# Patient Record
Sex: Female | Born: 1975 | Hispanic: Yes | Marital: Single | Smoking: Never smoker
Health system: Southern US, Community
[De-identification: ages and names within clinical notes are randomized; demographics above are authoritative.]

## PROBLEM LIST (undated history)

## (undated) HISTORY — PX: OTHER SURGICAL HISTORY: SHX169

---

## 2011-12-10 ENCOUNTER — Emergency Department: Payer: Self-pay | Admitting: Unknown Physician Specialty

## 2011-12-12 LAB — BETA STREP CULTURE(ARMC)

## 2015-11-20 ENCOUNTER — Inpatient Hospital Stay: Payer: Self-pay

## 2015-11-20 ENCOUNTER — Inpatient Hospital Stay
Admission: EM | Admit: 2015-11-20 | Discharge: 2015-11-22 | DRG: 443 | Disposition: A | Discharge status: Home / Self Care | Payer: Self-pay | Attending: Internal Medicine | Admitting: Internal Medicine

## 2015-11-20 ENCOUNTER — Emergency Department: Payer: Self-pay

## 2015-11-20 DIAGNOSIS — R0600 Dyspnea, unspecified: Secondary | ICD-10-CM

## 2015-11-20 DIAGNOSIS — Z833 Family history of diabetes mellitus: Secondary | ICD-10-CM

## 2015-11-20 DIAGNOSIS — R0781 Pleurodynia: Secondary | ICD-10-CM | POA: Diagnosis present

## 2015-11-20 DIAGNOSIS — R740 Nonspecific elevation of levels of transaminase and lactic acid dehydrogenase [LDH]: Secondary | ICD-10-CM | POA: Diagnosis present

## 2015-11-20 DIAGNOSIS — R112 Nausea with vomiting, unspecified: Secondary | ICD-10-CM

## 2015-11-20 DIAGNOSIS — M79669 Pain in unspecified lower leg: Secondary | ICD-10-CM

## 2015-11-20 DIAGNOSIS — R109 Unspecified abdominal pain: Secondary | ICD-10-CM | POA: Diagnosis present

## 2015-11-20 DIAGNOSIS — I81 Portal vein thrombosis: Principal | ICD-10-CM | POA: Diagnosis present

## 2015-11-20 DIAGNOSIS — Z6837 Body mass index (BMI) 37.0-37.9, adult: Secondary | ICD-10-CM

## 2015-11-20 DIAGNOSIS — E669 Obesity, unspecified: Secondary | ICD-10-CM | POA: Diagnosis present

## 2015-11-20 LAB — CBC WITH DIFFERENTIAL/PLATELET
Basophils Absolute: 0 10*3/uL (ref 0–0.1)
Eosinophils Absolute: 0.2 10*3/uL (ref 0–0.7)
HEMATOCRIT: 42.8 % (ref 35.0–47.0)
Hemoglobin: 14.6 g/dL (ref 12.0–16.0)
Lymphs Abs: 3.3 10*3/uL (ref 1.0–3.6)
MCH: 30.5 pg (ref 26.0–34.0)
MCHC: 34.2 g/dL (ref 32.0–36.0)
MCV: 89.3 fL (ref 80.0–100.0)
Monocytes Absolute: 1 10*3/uL — ABNORMAL HIGH (ref 0.2–0.9)
Monocytes Relative: 11 %
NEUTROS ABS: 4.2 10*3/uL (ref 1.4–6.5)
Neutrophils Relative %: 49 %
PLATELETS: 260 10*3/uL (ref 150–440)
RBC: 4.79 MIL/uL (ref 3.80–5.20)
RDW: 13.1 % (ref 11.5–14.5)
WBC: 8.7 10*3/uL (ref 3.6–11.0)

## 2015-11-20 LAB — URINALYSIS COMPLETE WITH MICROSCOPIC (ARMC ONLY)
BILIRUBIN URINE: NEGATIVE
Bacteria, UA: NONE SEEN
GLUCOSE, UA: NEGATIVE mg/dL
HGB URINE DIPSTICK: NEGATIVE
KETONES UR: NEGATIVE mg/dL
NITRITE: NEGATIVE
Protein, ur: NEGATIVE mg/dL
RBC / HPF: NONE SEEN RBC/hpf (ref 0–5)
SPECIFIC GRAVITY, URINE: 1.005 (ref 1.005–1.030)
pH: 8 (ref 5.0–8.0)

## 2015-11-20 LAB — COMPREHENSIVE METABOLIC PANEL
ALBUMIN: 3.9 g/dL (ref 3.5–5.0)
ALT: 68 U/L — ABNORMAL HIGH (ref 14–54)
ANION GAP: 8 (ref 5–15)
AST: 52 U/L — ABNORMAL HIGH (ref 15–41)
Alkaline Phosphatase: 77 U/L (ref 38–126)
BILIRUBIN TOTAL: 0.5 mg/dL (ref 0.3–1.2)
BUN: 12 mg/dL (ref 6–20)
CO2: 24 mmol/L (ref 22–32)
Calcium: 9.4 mg/dL (ref 8.9–10.3)
Chloride: 107 mmol/L (ref 101–111)
Creatinine, Ser: 0.57 mg/dL (ref 0.44–1.00)
GFR calc Af Amer: >60 mL/min (ref >60)
Glucose, Bld: 107 mg/dL — ABNORMAL HIGH (ref 65–99)
POTASSIUM: 3.9 mmol/L (ref 3.5–5.1)
Sodium: 139 mmol/L (ref 135–145)
TOTAL PROTEIN: 7.3 g/dL (ref 6.5–8.1)

## 2015-11-20 LAB — TROPONIN I
Troponin I: <0.03 ng/mL (ref <0.031)
Troponin I: <0.03 ng/mL (ref <0.031)
Troponin I: <0.03 ng/mL (ref <0.031)

## 2015-11-20 LAB — HEPARIN LEVEL (UNFRACTIONATED)
Heparin Unfractionated: 0.64 IU/mL (ref 0.30–0.70)
Heparin Unfractionated: 0.59 IU/mL (ref 0.30–0.70)

## 2015-11-20 LAB — PROTIME-INR
INR: 0.98
Prothrombin Time: 13.2 seconds (ref 11.4–15.0)

## 2015-11-20 LAB — LIPASE, BLOOD: LIPASE: 21 U/L (ref 11–51)

## 2015-11-20 LAB — APTT: aPTT: 31 seconds (ref 24–36)

## 2015-11-20 LAB — POCT PREGNANCY, URINE: PREG TEST UR: NEGATIVE

## 2015-11-20 MED ORDER — HEPARIN (PORCINE) IN NACL 100-0.45 UNIT/ML-% IJ SOLN
1200.0000 [IU]/h | INTRAMUSCULAR | Status: DC
  Administered 2015-11-20 – 2015-11-21 (×2): 1200 [IU]/h via INTRAVENOUS
  Filled 2015-11-20 (×6): qty 250

## 2015-11-20 MED ORDER — HEPARIN BOLUS VIA INFUSION
4000.0000 [IU] | Freq: Once | INTRAVENOUS | Status: AC
  Administered 2015-11-20: 4000 [IU] via INTRAVENOUS
  Filled 2015-11-20: qty 4000

## 2015-11-20 MED ORDER — ONDANSETRON HCL 4 MG/2ML IJ SOLN: 4.0000 mg | Freq: Four times a day (QID) | INTRAMUSCULAR | Status: DC | PRN

## 2015-11-20 MED ORDER — HYDROCODONE-ACETAMINOPHEN 5-325 MG PO TABS
1.0000 | ORAL_TABLET | ORAL | Status: DC | PRN
  Administered 2015-11-20 – 2015-11-21 (×3): 1 via ORAL
  Filled 2015-11-20 (×2): qty 1
  Filled 2015-11-20: qty 2
  Filled 2015-11-20: qty 1

## 2015-11-20 MED ORDER — SODIUM CHLORIDE 0.9 % IV SOLN
INTRAVENOUS | Status: DC
  Administered 2015-11-20 – 2015-11-22 (×3): via INTRAVENOUS

## 2015-11-20 MED ORDER — MORPHINE SULFATE (PF) 2 MG/ML IV SOLN: 2.0000 mg | INTRAVENOUS | Status: DC | PRN

## 2015-11-20 MED ORDER — IOPAMIDOL (ISOVUE-370) INJECTION 76%
100.0000 mL | Freq: Once | INTRAVENOUS | Status: AC | PRN
  Administered 2015-11-20: 100 mL via INTRAVENOUS

## 2015-11-20 MED ORDER — SENNOSIDES-DOCUSATE SODIUM 8.6-50 MG PO TABS: 1.0000 | ORAL_TABLET | Freq: Every evening | ORAL | Status: DC | PRN

## 2015-11-20 MED ORDER — ONDANSETRON HCL 4 MG/2ML IJ SOLN
4.0000 mg | Freq: Once | INTRAMUSCULAR | Status: AC
  Administered 2015-11-20: 4 mg via INTRAVENOUS
  Filled 2015-11-20: qty 2

## 2015-11-20 MED ORDER — DIATRIZOATE MEGLUMINE & SODIUM 66-10 % PO SOLN
15.0000 mL | Freq: Once | ORAL | Status: AC
  Administered 2015-11-20: 15 mL via ORAL
  Filled 2015-11-20: qty 30

## 2015-11-20 MED ORDER — ONDANSETRON HCL 4 MG PO TABS: 4.0000 mg | ORAL_TABLET | Freq: Four times a day (QID) | ORAL | Status: DC | PRN

## 2015-11-20 MED ORDER — MORPHINE SULFATE (PF) 4 MG/ML IV SOLN
4.0000 mg | Freq: Once | INTRAVENOUS | Status: AC
  Administered 2015-11-20: 4 mg via INTRAVENOUS
  Filled 2015-11-20: qty 1

## 2015-11-20 NOTE — Progress Notes (Signed)
ANTICOAGULATION CONSULT NOTE - Follow Up Consult  Pharmacy Consult for heparin Indication: portal vein thrombosis  No Known Allergies  Patient Measurements: Height: 5\' 1"  (154.9 cm) Weight: 200 lb (90.719 kg) IBW/kg (Calculated) : 47.8 Heparin Dosing Weight: 69 kg  Vital Signs: Temp: 98.7 F (37.1 C) (04/27 0827) Temp Source: Oral (04/27 0827) BP: 110/69 mmHg (04/27 0827) Pulse Rate: 72 (04/27 0827)  Labs:  Recent Labs  11/20/15 0155 11/20/15 0607 11/20/15 0722 11/20/15 1302  HGB 14.6  --   --   --   HCT 42.8  --   --   --   PLT 260  --   --   --   APTT  --  31  --   --   LABPROT  --  13.2  --   --   INR  --  0.98  --   --   HEPARINUNFRC  --   --   --  0.64  CREATININE 0.57  --   --   --   TROPONINI <0.03  --  <0.03  --     Estimated Creatinine Clearance: 95.9 mL/min (by C-G formula based on Cr of 0.57).   Medical History: History reviewed. No pertinent past medical history.  Medications:  Infusions:  . sodium chloride 75 mL/hr at 11/20/15 0844  . heparin 1,200 Units/hr (11/20/15 0705)    Assessment: Kristina Eaton with chest pain since 10 AM and noted left portal vein thrombosis on CT. Pharmacy consulted to dose and monitor heparin drip.  Patient currently ordered heparin drip at rate of 1200 units/hr  HL on 4/27 at 1300: 0.64  Goal of Therapy:  Heparin level 0.3-0.7 units/ml Monitor platelets by anticoagulation protocol: Yes   Plan:  HL within desired range.  Will continue current rate and recheck a confirmatory level in 6 hours at 1900.  If level remains in goal range, may transition to daily HL.   Estus Krakowski G, Pharm.D., BCPS Clinical Pharmacist 11/20/2015,1:39 PM

## 2015-11-20 NOTE — ED Notes (Signed)
Pt transported to xray via stretcher

## 2015-11-20 NOTE — ED Notes (Signed)
Per ems and pt report to interpreter Gabrielle [id number] she started having chest pain this am at 10am and since then has started to radiate into back - pt also reports no period in 6 months and has noticed abd growth with movement in her stomach 

## 2015-11-20 NOTE — H&P (Signed)
[facility] [place] [facility]   PATIENT NAME: Amiree No    MR#:  [id number]  DATE OF BIRTH:  04-21-1976  DATE OF ADMISSION:  11/20/2015  PRIMARY CARE PHYSICIAN: No PCP Per Patient   REQUESTING/REFERRING PHYSICIAN:   CHIEF COMPLAINT:   Chief Complaint  Patient presents with  . Chest Pain    HISTORY OF PRESENT ILLNESS: Ruchama Kubicek  is a 40 y.o. female with no significant past medical history presented to the emergency room with chest pain and abdominal pain of one-day duration. The abdominal pain is located in the epigastrium and right upper quadrant pain is sharp in nature is 8 out of 10 on scale of 1-10. No history of any nausea or vomiting. No history of fever or chills or cough. Patient complains of chest pain which is aching in nature 4 out of 10. Patient was worked up with abdominal ultrasound in the emergency room which did not show any gallstones. She was worked up with a CT chest and abdomen which showed portal vein thrombosis. No history of any prior DVT or any clots. Case was discussed with vascular surgery by ER physician who recommended to start patient on IV heparin drip. No history of any recent travel.  PAST MEDICAL HISTORY:  History reviewed. No pertinent past medical history.  PAST SURGICAL HISTORY: Past Surgical History  Procedure Laterality Date  . None      SOCIAL HISTORY:  Social History  Substance Use Topics  . Smoking status: Never Smoker   . Smokeless tobacco: Not on file  . Alcohol Use: No    FAMILY HISTORY:  Family History  Problem Relation Age of Onset  . Diabetes Brother     DRUG ALLERGIES: No Known Allergies  REVIEW OF SYSTEMS:   CONSTITUTIONAL: No fever, fatigue or weakness.  EYES: No blurred or double vision.  EARS, NOSE, AND THROAT: No tinnitus or ear pain.  RESPIRATORY: No cough, shortness of breath, wheezing or hemoptysis.  CARDIOVASCULAR: Has  chest pain,no orthopnea, edema.  GASTROINTESTINAL: No  nausea, vomiting, diarrhea, has abdominal pain.  GENITOURINARY: No dysuria, hematuria.  ENDOCRINE: No polyuria, nocturia,  HEMATOLOGY: No anemia, easy bruising or bleeding SKIN: No rash or lesion. MUSCULOSKELETAL: No joint pain or arthritis.   NEUROLOGIC: No tingling, numbness, weakness.  PSYCHIATRY: No anxiety or depression.   MEDICATIONS AT HOME:  Prior to Admission medications   Not on File      PHYSICAL EXAMINATION:   VITAL SIGNS: Blood pressure 103/66, pulse 82, temperature 98.4 F (36.9 C), temperature source Oral, resp. rate 14, height 5\' 1"  (1.549 m), weight 90.719 kg (200 lb), last menstrual period 05/22/2015, SpO2 95 %.  GENERAL:  40 y.o.-year-old patient lying in the bed with no acute distress.  EYES: Pupils equal, round, reactive to light and accommodation. No scleral icterus. Extraocular muscles intact.  HEENT: Head atraumatic, normocephalic. Oropharynx and nasopharynx clear.  NECK:  Supple, no jugular venous distention. No thyroid enlargement, no tenderness.  LUNGS: Normal breath sounds bilaterally, no wheezing, rales,rhonchi or crepitation. No use of accessory muscles of respiration.  CARDIOVASCULAR: S1, S2 normal. No murmurs, rubs, or gallops.  ABDOMEN: Soft, tenderness in the epigastrium,right upper quadrant, nondistended. Bowel sounds present. No organomegaly or mass.  EXTREMITIES: No pedal edema, cyanosis, or clubbing.  NEUROLOGIC: Cranial nerves II through XII are intact. Muscle strength 5/5 in all extremities. Sensation intact. Gait normal. PSYCHIATRIC: The patient is alert and oriented x 3.  SKIN: No obvious rash, lesion, or  ulcer.   LABORATORY PANEL:   CBC  Recent Labs Lab 11/20/15 0155  WBC 8.7  HGB 14.6  HCT 42.8  PLT 260  MCV 89.3  MCH 30.5  MCHC 34.2  RDW 13.1  LYMPHSABS 3.3  MONOABS 1.0*  EOSABS 0.2  BASOSABS 0.0    ------------------------------------------------------------------------------------------------------------------  Chemistries   Recent Labs Lab 11/20/15 0155  NA 139  K 3.9  CL 107  CO2 24  GLUCOSE 107*  BUN 12  CREATININE 0.57  CALCIUM 9.4  AST 52*  ALT 68*  ALKPHOS 77  BILITOT 0.5   ------------------------------------------------------------------------------------------------------------------ estimated creatinine clearance is 95.9 mL/min (by C-G formula based on Cr of 0.57). ------------------------------------------------------------------------------------------------------------------ No results for input(s): TSH, T4TOTAL, T3FREE, THYROIDAB in the last 72 hours.  Invalid input(s): FREET3   Coagulation profile  Recent Labs Lab 11/20/15 0607  INR 0.98   ------------------------------------------------------------------------------------------------------------------- No results for input(s): DDIMER in the last 72 hours. -------------------------------------------------------------------------------------------------------------------  Cardiac Enzymes  Recent Labs Lab 11/20/15 0155  TROPONINI <0.03   ------------------------------------------------------------------------------------------------------------------ Invalid input(s): POCBNP  ---------------------------------------------------------------------------------------------------------------  Urinalysis    Component Value Date/Time   COLORURINE COLORLESS* 11/20/2015 0241   APPEARANCEUR CLEAR* 11/20/2015 0241   LABSPEC 1.005 11/20/2015 0241   PHURINE 8.0 11/20/2015 0241   GLUCOSEU NEGATIVE 11/20/2015 0241   HGBUR NEGATIVE 11/20/2015 0241   BILIRUBINUR NEGATIVE 11/20/2015 0241   KETONESUR NEGATIVE 11/20/2015 0241   PROTEINUR NEGATIVE 11/20/2015 0241   NITRITE NEGATIVE 11/20/2015 0241   LEUKOCYTESUR TRACE* 11/20/2015 0241     RADIOLOGY: Dg Chest 2 View  11/20/2015  CLINICAL DATA:   Acute onset of generalized chest pain, radiating to the back. Initial encounter. EXAM: CHEST  2 VIEW COMPARISON:  None. FINDINGS: The lungs are mildly hypoexpanded. Mild vascular congestion is noted. There is no evidence of focal opacification, pleural effusion or pneumothorax. The heart is normal in size; the mediastinal contour is within normal limits. No acute osseous abnormalities are seen. IMPRESSION: Lungs mildly hypoexpanded. Mild vascular congestion noted. Lungs remain grossly clear. Electronically Signed   By: Roanna Raider M.D.   On: 11/20/2015 03:36   Ct Angio Chest Pe W/cm &/or Wo Cm  11/20/2015  CLINICAL DATA:  Severe chest pain with inspiration. Absent periods for 6 months and increasing abdominal distention. EXAM: CT ANGIOGRAPHY CHEST CT ABDOMEN AND PELVIS WITH CONTRAST TECHNIQUE: Multidetector CT imaging of the chest was performed using the standard protocol during bolus administration of intravenous contrast. Multiplanar CT image reconstructions and MIPs were obtained to evaluate the vascular anatomy. Multidetector CT imaging of the abdomen and pelvis was performed using the standard protocol during bolus administration of intravenous contrast. CONTRAST:  100 cc Isovue 370 intravenous COMPARISON:  None. FINDINGS: CTA CHEST FINDINGS THORACIC INLET/BODY WALL: No acute abnormality. MEDIASTINUM: Normal heart size. No pericardial effusion. Evaluation of peripheral pulmonary arteries is limited by interruption of contrast. There is no evidence of pulmonary embolism. Calcified right hilar lymph nodes attributed to previous granulomatous disease. Small hiatal hernia with poor esophageal clearance or reflux. LUNG WINDOWS: There is no edema, consolidation, effusion, or pneumothorax. OSSEOUS: No acute fracture.  No suspicious lytic or blastic lesions. CT ABDOMEN and PELVIS FINDINGS Abdominal wall:  No contributory findings. Hepatobiliary: Left portal vein thrombosis, beginning at the bifurcation, with  vessel expansion and differential liver enhancement. No underlying inflammation or portal venous gas. No evidence of biliary obstruction or stone. Pancreas: Unremarkable. Spleen: Unremarkable. Adrenals/Urinary Tract: Negative adrenals. No hydronephrosis or stone. Unremarkable bladder. Reproductive:No pathologic findings.  Corpus luteum on the left.  Stomach/Bowel:  No obstruction. No appendicitis. Vascular/Lymphatic: No acute vascular abnormality. No mass or adenopathy. Peritoneal: No ascites or pneumoperitoneum. Musculoskeletal: Negative. Review of the MIP images confirms the above findings. IMPRESSION: 1. Left portal vein thrombosis. 2. No evidence of pulmonary embolism or other acute cardiopulmonary disease. Electronically Signed   By: Marnee Spring M.D.   On: 11/20/2015 05:51   Ct Abdomen Pelvis W Contrast  11/20/2015  CLINICAL DATA:  Severe chest pain with inspiration. Absent periods for 6 months and increasing abdominal distention. EXAM: CT ANGIOGRAPHY CHEST CT ABDOMEN AND PELVIS WITH CONTRAST TECHNIQUE: Multidetector CT imaging of the chest was performed using the standard protocol during bolus administration of intravenous contrast. Multiplanar CT image reconstructions and MIPs were obtained to evaluate the vascular anatomy. Multidetector CT imaging of the abdomen and pelvis was performed using the standard protocol during bolus administration of intravenous contrast. CONTRAST:  100 cc Isovue 370 intravenous COMPARISON:  None. FINDINGS: CTA CHEST FINDINGS THORACIC INLET/BODY WALL: No acute abnormality. MEDIASTINUM: Normal heart size. No pericardial effusion. Evaluation of peripheral pulmonary arteries is limited by interruption of contrast. There is no evidence of pulmonary embolism. Calcified right hilar lymph nodes attributed to previous granulomatous disease. Small hiatal hernia with poor esophageal clearance or reflux. LUNG WINDOWS: There is no edema, consolidation, effusion, or pneumothorax.  OSSEOUS: No acute fracture.  No suspicious lytic or blastic lesions. CT ABDOMEN and PELVIS FINDINGS Abdominal wall:  No contributory findings. Hepatobiliary: Left portal vein thrombosis, beginning at the bifurcation, with vessel expansion and differential liver enhancement. No underlying inflammation or portal venous gas. No evidence of biliary obstruction or stone. Pancreas: Unremarkable. Spleen: Unremarkable. Adrenals/Urinary Tract: Negative adrenals. No hydronephrosis or stone. Unremarkable bladder. Reproductive:No pathologic findings.  Corpus luteum on the left. Stomach/Bowel:  No obstruction. No appendicitis. Vascular/Lymphatic: No acute vascular abnormality. No mass or adenopathy. Peritoneal: No ascites or pneumoperitoneum. Musculoskeletal: Negative. Review of the MIP images confirms the above findings. IMPRESSION: 1. Left portal vein thrombosis. 2. No evidence of pulmonary embolism or other acute cardiopulmonary disease. Electronically Signed   By: Marnee Spring M.D.   On: 11/20/2015 05:51   US Abdomen Limited Ruq  11/20/2015  CLINICAL DATA:  Right upper quadrant and epigastric pain with nausea and vomiting EXAM: US ABDOMEN LIMITED - RIGHT UPPER QUADRANT COMPARISON:  None. FINDINGS: Gallbladder: No gallstones or wall thickening visualized. No sonographic Murphy sign noted by sonographer. Common bile duct: Diameter: 4 mm.  Where visualized, no filling defect. Liver: No focal lesion identified. Within normal limits in parenchymal echogenicity. Antegrade flow in the imaged portal venous system. IMPRESSION: Normal study. Electronically Signed   By: Marnee Spring M.D.   On: 11/20/2015 04:00    EKG: Orders placed or performed during the hospital encounter of 11/20/15  . EKG 12-Lead  . EKG 12-Lead    IMPRESSION AND PLAN: 40 year old female patient with no significant past medical history presented to the emergency room with abdominal pain and chest discomfort. Admitting diagnosis 1. Abdominal  pain 2. Left portal vein thrombosis 3. Transaminitis 4. Atypical chest pain Treatment plan Admit patient to medical floor inpatient service Start patient on IV heparin drip Follow-up liver function tests Pain management with oral narcotic and when necessary morphine Hematology consult for portal vein thrombosis Cycle troponin to rule out ischemia Discussed medical condition and treatment plan with patient in detail with the help of the translator.  All the records are reviewed and case discussed with ED provider. Management plans discussed with the patient, family and  they are in agreement.  CODE STATUS:FULL Code Status History    This patient does not have a recorded code status. Please follow your organizational policy for patients in this situation.       TOTAL TIME TAKING CARE OF THIS PATIENT: 51 minutes.    Ihor AustinPavan Marrell Dicaprio M.D on 11/20/2015 at 6:51 AM  Between 7am to 6pm - Pager - [phone]  After 6pm go to [email or web address] - password [email or web address] [facility]  [facility]  Office  [phone]  CC: Primary care physician; No PCP Per Patient

## 2015-11-20 NOTE — Progress Notes (Signed)
The pt complains of diffuse abd pain and weakness. VS reviewed, stable and physical examination done. B/l calf tenderness.  Left portal vein thrombosis. Continue heparin drip. F/u hematology consult.  B/l calf tenderness. Venous US to R/O DVT.  With help of spanish interpretor.

## 2015-11-20 NOTE — ED Notes (Signed)
Pt transported to US via stretcher.  

## 2015-11-20 NOTE — ED Provider Notes (Signed)
[facility] Emergency Department Provider Note  ____________________________________________  Time seen: Approximately 3:16 AM  I have reviewed the triage vital signs and the nursing notes.   HISTORY  Chief Complaint Chest Pain  The patient and/or family speak(s) Spanish.  They understand they have the right to the use of a hospital interpreter, however at this time they prefer to speak directly with me in Spanish.  They know that they can ask for an interpreter at any time.   HPI Emyah Roznowski is a 40 y.o. female with no significant past medical history who presents with acute onset of what she describes as chest pain earlier today.He states that the pain will get better but then come back very strong.  It is located at the bottom of her sternum as well as on the right side. It radiates through to her back.  The pain is worse with deep breaths.  She has never experienced anything like this before.  She has no history of heart disease, diabetes, smoking, hypercholesterolemia.  She has never had any surgeries.  The pain started about 15 hours ago and has been getting worse.  She has not had any lower abdominal pain nor bowel symptoms.  She has also been having some burning when she urinates which she describes as severe.  Overall she states that her pain is severe and nothing is making it better or worse.   History reviewed. No pertinent past medical history.  Patient Active Problem List   Diagnosis Date Noted  . Abdominal pain 11/20/2015  . Portal vein thrombosis 11/20/2015    Past Surgical History  Procedure Laterality Date  . None      No current outpatient prescriptions on file.  Allergies Review of patient's allergies indicates no known allergies.  Family History  Problem Relation Age of Onset  . Diabetes Brother     Social History Social History  Substance Use Topics  . Smoking status: Never Smoker   . Smokeless tobacco: None  . Alcohol  Use: No    Review of Systems Constitutional: No fever/chills Eyes: No visual changes. ENT: No sore throat. Cardiovascular: +chest pain. Respiratory: Denies shortness of breath. Gastrointestinal: Upper abdominal pain.  Nausea and vomiting.  No diarrhea.  No constipation. Genitourinary: +dysuria. Musculoskeletal: Negative for back pain. Skin: Negative for rash. Neurological: Negative for headaches, focal weakness or numbness.  10-point ROS otherwise negative.  ____________________________________________   PHYSICAL EXAM:  VITAL SIGNS: ED Triage Vitals  Enc Vitals Group     BP 11/20/15 0153 132/71 mmHg     Pulse Rate 11/20/15 0153 87     Resp 11/20/15 0153 20     Temp 11/20/15 0153 98.4 F (36.9 C)     Temp Source 11/20/15 0153 Oral     SpO2 11/20/15 0153 99 %     Weight 11/20/15 0153 205 lb (92.987 kg)     Height 11/20/15 0153 5\' 7"  (1.702 m)     Head Cir --      Peak Flow --      Pain Score 11/20/15 0216 9     Pain Loc --      Pain Edu? --      Excl. in GC? --     Constitutional: Alert and oriented. Tearful, appears to be in pain Eyes: Conjunctivae are normal. PERRL. EOMI. Head: Atraumatic. Nose: No congestion/rhinnorhea. Mouth/Throat: Mucous membranes are moist.  Oropharynx non-erythematous. Neck: No stridor.  No meningeal signs.   Cardiovascular: Normal rate,  regular rhythm. Good peripheral circulation. Grossly normal heart sounds.  No chest wall tenderness Respiratory: Normal respiratory effort.  No retractions. Lungs CTAB. Gastrointestinal: Obese.  Severe tenderness to palpation of her epigastrium and right upper quadrant with positive Murphy sign.  No lower abdominal pain or tenderness. Musculoskeletal: No lower extremity tenderness nor edema. No gross deformities of extremities. Neurologic:  Normal speech and language. No gross focal neurologic deficits are appreciated.  Skin:  Skin is warm, dry and intact. No rash noted. Psychiatric: Mood and affect are  normal. Speech and behavior are normal.  ____________________________________________   LABS (all labs ordered are listed, but only abnormal results are displayed)  Labs Reviewed  CBC WITH DIFFERENTIAL/PLATELET - Abnormal; Notable for the following:    Monocytes Absolute 1.0 (*)    All other components within normal limits  COMPREHENSIVE METABOLIC PANEL - Abnormal; Notable for the following:    Glucose, Bld 107 (*)    AST 52 (*)    ALT 68 (*)    All other components within normal limits  URINALYSIS COMPLETEWITH MICROSCOPIC ([facility] ONLY) - Abnormal; Notable for the following:    Color, Urine COLORLESS (*)    APPearance CLEAR (*)    Leukocytes, UA TRACE (*)    Squamous Epithelial / LPF 0-5 (*)    All other components within normal limits  LIPASE, BLOOD  TROPONIN I  APTT  PROTIME-INR  HEPARIN LEVEL (UNFRACTIONATED)  TROPONIN I  TROPONIN I  TROPONIN I  POC URINE PREG, ED  POCT PREGNANCY, URINE   ____________________________________________  EKG  ED ECG REPORT I, Malayjah Otoole, the attending physician, personally viewed and interpreted this ECG.  Date: 11/20/2015 EKG Time: 01:49 Rate: 88 Rhythm: normal sinus rhythm QRS Axis: normal Intervals: normal ST/T Wave abnormalities: normal Conduction Disturbances: none Narrative Interpretation: unremarkable  ____________________________________________  RADIOLOGY   Dg Chest 2 View  11/20/2015  CLINICAL DATA:  Acute onset of generalized chest pain, radiating to the back. Initial encounter. EXAM: CHEST  2 VIEW COMPARISON:  None. FINDINGS: The lungs are mildly hypoexpanded. Mild vascular congestion is noted. There is no evidence of focal opacification, pleural effusion or pneumothorax. The heart is normal in size; the mediastinal contour is within normal limits. No acute osseous abnormalities are seen. IMPRESSION: Lungs mildly hypoexpanded. Mild vascular congestion noted. Lungs remain grossly clear. Electronically Signed   By:  Roanna RaiderJeffery  Chang M.D.   On: 11/20/2015 03:36   Ct Angio Chest Pe W/cm &/or Wo Cm  11/20/2015  CLINICAL DATA:  Severe chest pain with inspiration. Absent periods for 6 months and increasing abdominal distention. EXAM: CT ANGIOGRAPHY CHEST CT ABDOMEN AND PELVIS WITH CONTRAST TECHNIQUE: Multidetector CT imaging of the chest was performed using the standard protocol during bolus administration of intravenous contrast. Multiplanar CT image reconstructions and MIPs were obtained to evaluate the vascular anatomy. Multidetector CT imaging of the abdomen and pelvis was performed using the standard protocol during bolus administration of intravenous contrast. CONTRAST:  100 cc Isovue 370 intravenous COMPARISON:  None. FINDINGS: CTA CHEST FINDINGS THORACIC INLET/BODY WALL: No acute abnormality. MEDIASTINUM: Normal heart size. No pericardial effusion. Evaluation of peripheral pulmonary arteries is limited by interruption of contrast. There is no evidence of pulmonary embolism. Calcified right hilar lymph nodes attributed to previous granulomatous disease. Small hiatal hernia with poor esophageal clearance or reflux. LUNG WINDOWS: There is no edema, consolidation, effusion, or pneumothorax. OSSEOUS: No acute fracture.  No suspicious lytic or blastic lesions. CT ABDOMEN and PELVIS FINDINGS Abdominal wall:  No  contributory findings. Hepatobiliary: Left portal vein thrombosis, beginning at the bifurcation, with vessel expansion and differential liver enhancement. No underlying inflammation or portal venous gas. No evidence of biliary obstruction or stone. Pancreas: Unremarkable. Spleen: Unremarkable. Adrenals/Urinary Tract: Negative adrenals. No hydronephrosis or stone. Unremarkable bladder. Reproductive:No pathologic findings.  Corpus luteum on the left. Stomach/Bowel:  No obstruction. No appendicitis. Vascular/Lymphatic: No acute vascular abnormality. No mass or adenopathy. Peritoneal: No ascites or pneumoperitoneum.  Musculoskeletal: Negative. Review of the MIP images confirms the above findings. IMPRESSION: 1. Left portal vein thrombosis. 2. No evidence of pulmonary embolism or other acute cardiopulmonary disease. Electronically Signed   By: Marnee Spring M.D.   On: 11/20/2015 05:51   Ct Abdomen Pelvis W Contrast  11/20/2015  CLINICAL DATA:  Severe chest pain with inspiration. Absent periods for 6 months and increasing abdominal distention. EXAM: CT ANGIOGRAPHY CHEST CT ABDOMEN AND PELVIS WITH CONTRAST TECHNIQUE: Multidetector CT imaging of the chest was performed using the standard protocol during bolus administration of intravenous contrast. Multiplanar CT image reconstructions and MIPs were obtained to evaluate the vascular anatomy. Multidetector CT imaging of the abdomen and pelvis was performed using the standard protocol during bolus administration of intravenous contrast. CONTRAST:  100 cc Isovue 370 intravenous COMPARISON:  None. FINDINGS: CTA CHEST FINDINGS THORACIC INLET/BODY WALL: No acute abnormality. MEDIASTINUM: Normal heart size. No pericardial effusion. Evaluation of peripheral pulmonary arteries is limited by interruption of contrast. There is no evidence of pulmonary embolism. Calcified right hilar lymph nodes attributed to previous granulomatous disease. Small hiatal hernia with poor esophageal clearance or reflux. LUNG WINDOWS: There is no edema, consolidation, effusion, or pneumothorax. OSSEOUS: No acute fracture.  No suspicious lytic or blastic lesions. CT ABDOMEN and PELVIS FINDINGS Abdominal wall:  No contributory findings. Hepatobiliary: Left portal vein thrombosis, beginning at the bifurcation, with vessel expansion and differential liver enhancement. No underlying inflammation or portal venous gas. No evidence of biliary obstruction or stone. Pancreas: Unremarkable. Spleen: Unremarkable. Adrenals/Urinary Tract: Negative adrenals. No hydronephrosis or stone. Unremarkable bladder. Reproductive:No  pathologic findings.  Corpus luteum on the left. Stomach/Bowel:  No obstruction. No appendicitis. Vascular/Lymphatic: No acute vascular abnormality. No mass or adenopathy. Peritoneal: No ascites or pneumoperitoneum. Musculoskeletal: Negative. Review of the MIP images confirms the above findings. IMPRESSION: 1. Left portal vein thrombosis. 2. No evidence of pulmonary embolism or other acute cardiopulmonary disease. Electronically Signed   By: Marnee Spring M.D.   On: 11/20/2015 05:51   US Abdomen Limited Ruq  11/20/2015  CLINICAL DATA:  Right upper quadrant and epigastric pain with nausea and vomiting EXAM: US ABDOMEN LIMITED - RIGHT UPPER QUADRANT COMPARISON:  None. FINDINGS: Gallbladder: No gallstones or wall thickening visualized. No sonographic Murphy sign noted by sonographer. Common bile duct: Diameter: 4 mm.  Where visualized, no filling defect. Liver: No focal lesion identified. Within normal limits in parenchymal echogenicity. Antegrade flow in the imaged portal venous system. IMPRESSION: Normal study. Electronically Signed   By: Marnee Spring M.D.   On: 11/20/2015 04:00    ____________________________________________   PROCEDURES  Procedure(s) performed: None  Critical Care performed: No ____________________________________________   INITIAL IMPRESSION / ASSESSMENT AND PLAN / ED COURSE  Pertinent labs & imaging results that were available during my care of the patient were reviewed by me and considered in my medical decision making (see chart for details).  I believe the with the patient is interpreting his chest pain is actually right upper quadrant and epigastric pain.  The most likely culprit is gallbladder  disease.  Her labs are all pending and I will proceed with a upper quadrant ultrasound.  I am giving morphine and Zofran for pain control.  I explained all this to the patient and her family and they understand and agree with the  plan.  ----------------------------------------- 4:23 AM on 11/20/2015 -----------------------------------------  Ultrasound was surprisingly unremarkable although the patient does have a slight elevation of her AST and ALT.  I will proceed with CT scans of both the chest and the abdomen and pelvis to try to establish the cause of her severe pain including ruling out pulmonary embolism.  ----------------------------------------- 5:56 AM on 11/20/2015 -----------------------------------------  CT scans notable for left portal vein thrombosis.  I have ordered heparin from the pharmacy and have paged Dr. Wyn Quaker with vascular surgery.   (Note that documentation was delayed due to multiple ED patients requiring immediate care.)   Spoke with Dr. Wyn Quaker who agreed with plan for heparin (bolus + infusion) and admission.  Discussed with family who understands and agrees. ____________________________________________  FINAL CLINICAL IMPRESSION(S) / ED DIAGNOSES  Final diagnoses:  Portal vein thrombosis  Abdominal pain, unspecified abdominal location  Non-intractable vomiting with nausea, vomiting of unspecified type      NEW MEDICATIONS STARTED DURING THIS VISIT:  New Prescriptions   No medications on file      Note:  This document was prepared using Dragon voice recognition software and may include unintentional dictation errors.   Loleta Rose, MD 11/20/15 [phone]

## 2015-11-20 NOTE — ED Notes (Signed)
Dr. Forbach at bedside.  

## 2015-11-20 NOTE — ED Notes (Signed)
Per ems and pt report to interpreter Kristina IvoryGabrielle [phone] she started having chest pain this am at 10am and since then has started to radiate into back - pt also reports no period in 6 months and has noticed abd growth with movement in her stomach

## 2015-11-20 NOTE — ED Notes (Signed)
Heparin infusion verified with April Brumgard, RN

## 2015-11-20 NOTE — Care Management (Signed)
Patient admitted for blood clot- no health insurance. Ana RN introduced me as patient is Spanish speaking. RNCM will follow up with Spanish interpreter to interview patient regarding PCP follow up and medication needs. Rx will be needed in order to assist with any med need. RNCM will continue to follow as physician decides what treatment is recommended.

## 2015-11-20 NOTE — ED Notes (Signed)
Pt finished drinking contrast, CT notified 

## 2015-11-20 NOTE — Progress Notes (Signed)
ANTICOAGULATION CONSULT NOTE - Initial Consult  Pharmacy Consult for heparin Indication: portal vein thrombosis  No Known Allergies  Patient Measurements: Height: 5\' 1"  (154.9 cm) Weight: 200 lb (90.719 kg) IBW/kg (Calculated) : 47.8 Heparin Dosing Weight: 69 kg  Vital Signs: Temp: 98.4 F (36.9 C) (04/27 0214) Temp Source: Oral (04/27 0214) BP: 103/66 mmHg (04/27 0430) Pulse Rate: 82 (04/27 0430)  Labs:  Recent Labs  11/20/15 0155  HGB 14.6  HCT 42.8  PLT 260  CREATININE 0.57  TROPONINI <0.03    Estimated Creatinine Clearance: 95.9 mL/min (by C-G formula based on Cr of 0.57).   Medical History: History reviewed. No pertinent past medical history.  Medications:  Infusions:  . heparin      Assessment: 39 yof with chest pain since 10 AM and noted left portal vein thrombosis on CT. Pharmacy consulted to dose heparin.  Goal of Therapy:  Heparin level 0.3-0.7 units/ml Monitor platelets by anticoagulation protocol: Yes   Plan:  Give 4000 units bolus x 1 Start heparin infusion at 1200 units/hr Check anti-Xa level in 6 hours and daily while on heparin Continue to monitor H&H and platelets  Carola FrostNathan A Rainn Zupko, Pharm.D., BCPS Clinical Pharmacist 11/20/2015,6:07 AM

## 2015-11-20 NOTE — Care Management Note (Addendum)
Case Management Note  Patient Details  Name: Kristina Eaton MRN: [id number] Date of Birth: Jan 18, 1976  Subjective/Objective:                  Met with patient and her husband along with [place] interpreter. Patient states she has lived in [place] for 10 years. She presents with a blood clot. She and her husband state that they have no income at this time when I asked if they make less than $2500/month. She has no PCP. She has transportation. Her contact is [phone]. She agrees to [facility] referral.  Action/Plan: Spanish application to [facility] delivered to patient along with Spanish version of Medication Management if they are able to assist. RNCM to follow for medication assistance as patient may need MATCH or any coupons available as I expect long-term treatment need.   Expected Discharge Date:                  Expected Discharge Plan:     In-House Referral:     Discharge planning Services  CM Consult, Medication Assistance, [place] [facility]  Post Acute Care Choice:    Choice offered to:     DME Arranged:    DME Agency:     HH Arranged:    HH Agency:     Status of Service:  In process, will continue to follow  Medicare Important Message Given:    Date Medicare IM Given:    Medicare IM give by:    Date Additional Medicare IM Given:    Additional Medicare Important Message give by:     If discussed at [place] of Stay Meetings, dates discussed:    Additional Comments:  Marshell Garfinkel, RN 11/20/2015, 9:04 AM

## 2015-11-20 NOTE — Progress Notes (Signed)
ANTICOAGULATION CONSULT NOTE - Follow Up Consult  Pharmacy Consult for heparin Indication: portal vein thrombosis  No Known Allergies  Patient Measurements: Height: 5\' 1"  (154.9 cm) Weight: 200 lb (90.719 kg) IBW/kg (Calculated) : 47.8 Heparin Dosing Weight: 69 kg  Vital Signs: Temp: 98.3 F (36.8 C) (04/27 1930) Temp Source: Oral (04/27 1930) BP: 96/54 mmHg (04/27 1931) Pulse Rate: 83 (04/27 1931)  Labs:  Recent Labs  11/20/15 0155 11/20/15 0607 11/20/15 0722 11/20/15 1302 11/20/15 1936  HGB 14.6  --   --   --   --   HCT 42.8  --   --   --   --   PLT 260  --   --   --   --   APTT  --  31  --   --   --   LABPROT  --  13.2  --   --   --   INR  --  0.98  --   --   --   HEPARINUNFRC  --   --   --  0.64 0.59  CREATININE 0.57  --   --   --   --   TROPONINI <0.03  --  <0.03 <0.03  --     Estimated Creatinine Clearance: 95.9 mL/min (by C-G formula based on Cr of 0.57).   Medical History: History reviewed. No pertinent past medical history.  Medications:  Infusions:  . sodium chloride 75 mL/hr at 11/20/15 1200  . heparin 1,200 Units/hr (11/20/15 1200)    Assessment: 39 yof with chest pain since 10 AM and noted left portal vein thrombosis on CT. Pharmacy consulted to dose and monitor heparin drip.  Patient currently ordered heparin drip at rate of 1200 units/hr  HL on 4/27 at 1300: 0.64  Goal of Therapy:  Heparin level 0.3-0.7 units/ml Monitor platelets by anticoagulation protocol: Yes   Plan:  HL within desired range.  Will continue current rate and recheck a confirmatory level in 6 hours at 1900.  If level remains in goal range, may transition to daily HL.   4/27:  HL @ 19:36 = 0.59.  Will continue pt on current rate and recheck HL on 4/28 with AM labs.   Quran Vasco D, Pharm.D. Clinical Pharmacist 11/20/2015,9:33 PM

## 2015-11-21 DIAGNOSIS — I81 Portal vein thrombosis: Principal | ICD-10-CM

## 2015-11-21 LAB — BASIC METABOLIC PANEL
Anion gap: 7 (ref 5–15)
BUN: 11 mg/dL (ref 6–20)
CALCIUM: 8.3 mg/dL — AB (ref 8.9–10.3)
CHLORIDE: 107 mmol/L (ref 101–111)
CO2: 23 mmol/L (ref 22–32)
Creatinine, Ser: 0.5 mg/dL (ref 0.44–1.00)
GFR calc Af Amer: >60 mL/min (ref >60)
GFR calc non Af Amer: >60 mL/min (ref >60)
Glucose, Bld: 98 mg/dL (ref 65–99)
Potassium: 3.7 mmol/L (ref 3.5–5.1)
SODIUM: 137 mmol/L (ref 135–145)

## 2015-11-21 LAB — ANTITHROMBIN III: AntiThromb III Func: 84 % (ref 75–120)

## 2015-11-21 LAB — HEPATIC FUNCTION PANEL
ALT: 54 U/L (ref 14–54)
AST: 32 U/L (ref 15–41)
Albumin: 3.5 g/dL (ref 3.5–5.0)
Alkaline Phosphatase: 72 U/L (ref 38–126)
TOTAL PROTEIN: 6.3 g/dL — AB (ref 6.5–8.1)
Total Bilirubin: 0.7 mg/dL (ref 0.3–1.2)

## 2015-11-21 LAB — CBC
HCT: 38.7 % (ref 35.0–47.0)
Hemoglobin: 13.3 g/dL (ref 12.0–16.0)
MCH: 30.6 pg (ref 26.0–34.0)
MCHC: 34.4 g/dL (ref 32.0–36.0)
MCV: 88.8 fL (ref 80.0–100.0)
PLATELETS: 247 10*3/uL (ref 150–440)
RBC: 4.36 MIL/uL (ref 3.80–5.20)
RDW: 13.2 % (ref 11.5–14.5)
WBC: 7 10*3/uL (ref 3.6–11.0)

## 2015-11-21 LAB — HEPARIN LEVEL (UNFRACTIONATED): Heparin Unfractionated: 0.55 [IU]/mL (ref 0.30–0.70)

## 2015-11-21 MED ORDER — ACETAMINOPHEN 325 MG PO TABS: 650.0000 mg | ORAL_TABLET | ORAL | Status: DC | PRN

## 2015-11-21 NOTE — Progress Notes (Signed)
ANTICOAGULATION CONSULT NOTE - Follow Up Consult  Pharmacy Consult for heparin Indication: portal vein thrombosis  No Known Allergies  Patient Measurements: Height: 5\' 1"  (154.9 cm) Weight: 200 lb (90.719 kg) IBW/kg (Calculated) : 47.8 Heparin Dosing Weight: 69 kg  Vital Signs: Temp: 99.1 F (37.3 C) (04/28 0408) Temp Source: Oral (04/28 0408) BP: 92/57 mmHg (04/28 0408) Pulse Rate: 81 (04/28 0408)  Labs:  Recent Labs  11/20/15 0155 11/20/15 0607 11/20/15 0722 11/20/15 1302 11/20/15 1936 11/21/15 0410  HGB 14.6  --   --   --   --  13.3  HCT 42.8  --   --   --   --  38.7  PLT 260  --   --   --   --  247  APTT  --  31  --   --   --   --   LABPROT  --  13.2  --   --   --   --   INR  --  0.98  --   --   --   --   HEPARINUNFRC  --   --   --  0.64 0.59 0.55  CREATININE 0.57  --   --   --   --  0.50  TROPONINI <0.03  --  <0.03 <0.03  --   --     Estimated Creatinine Clearance: 95.9 mL/min (by C-G formula based on Cr of 0.5).   Medical History: History reviewed. No pertinent past medical history.  Medications:  Infusions:  . sodium chloride 75 mL/hr at 11/20/15 1200  . heparin 1,200 Units/hr (11/20/15 1200)    Assessment: 39 yof with chest pain since 10 AM and noted left portal vein thrombosis on CT. Pharmacy consulted to dose and monitor heparin drip.  Patient currently ordered heparin drip at rate of 1200 units/hr  HL on 4/27 at 1300: 0.64  Goal of Therapy:  Heparin level 0.3-0.7 units/ml Monitor platelets by anticoagulation protocol: Yes   Plan:  Heparin level therapeutic. Continue current rate. Pharmacy will continue to monitor daily.   Carola FrostNathan A Krysta Bloomfield, Pharm.D., BCPS Clinical Pharmacist 11/21/2015,5:28 AM

## 2015-11-21 NOTE — Consult Note (Signed)
[facility]  Date of admission:  11/20/2015  Inpatient day:  11/21/2015  Consulting physician:  Dr. Saundra Shelling   Reason for Consultation:  Portal vein thrombosis.  Chief Complaint: Kristina Eaton is a 40 y.o. female with no significant past medical history who was admitted through the emergency room with portal vein thrombosis.   HPI:  Prior to recent events, the patient had not seen a physician in over 10 years.  She was in her usual state of good health until approximately 1 week ago when she developed mild right upper quadrant pain.  Pain progressed over the week.  She presented to the emergency room when she developed shortness of breath.  She denied any pleuritic chest pain, nausea or vomiting.  She denied any melena or hematochezia.  She denied any fever.  Initial labs included a normal CBC with diff.  Comprehensive metabolic panel included an AST of 52 and an ALT of 68.  Lipase was 21.  Chest CT angiogram on 11/20/2015 revealed no evidence of pulmonary embolism.  Abdominal and pelvic CT scan on 11/20/2015 revealed left portal vein thrombosis beginning at the bifurcation with vessel expansion and differential liver enhancement.  Bilateral lower extremity duplex on 11/20/2015 revealed no evidence of DVT.  She was started on IV heparin.  Symptomatically, she feels a little better.  She has no family history of any blood disorders, thrombosis, early pregnancy loss, or connective tissue disorder (rheumatoid arthritis, lupus, etc).   History reviewed. No pertinent past medical history.  Past Surgical History  Procedure Laterality Date  . None      Family History  Problem Relation Age of Onset  . Diabetes Brother     Social History:  reports that she has never smoked. She does not have any smokeless tobacco history on file. She reports that she does not drink alcohol or use illicit drugs.  She is from [place].  She has 3 children.  The is accompanied by  her boyfriend, daughter, and the interpreter.  Allergies: No Known Allergies  No prescriptions prior to admission    Review of Systems: GENERAL:  Until recent events, she has felt good.  Active.  No fevers, sweats or weight loss. PERFORMANCE STATUS (ECOG):  1 HEENT:  No visual changes, runny nose, sore throat, mouth sores or tenderness. Lungs: No shortness of breath or cough.  No hemoptysis. Cardiac:  No chest pain, palpitations, orthopnea, or PND. GI:  No nausea, vomiting, diarrhea, constipation, melena or hematochezia. GU:  No urgency, frequency, dysuria, or hematuria. Musculoskeletal:  Joints "hurt" (shoulder > hands).  No back pain. No muscle tenderness. Extremities:  No pain or swelling. Skin:  No rashes or skin changes. Neuro:  Headache only while hospitalized.  No numbness or weakness, balance or coordination issues. Endocrine:  No diabetes, thyroid issues, hot flashes or night sweats. Psych:  No mood changes, depression or anxiety. Pain:  No focal pain. Review of systems:  All other systems reviewed and found to be negative.  Physical Exam:  Blood pressure 92/57, pulse 81, temperature 99.1 F (37.3 C), temperature source Oral, resp. rate 18, height 5' 1"  (1.549 m), weight 200 lb (90.719 kg), last menstrual period 05/22/2015, SpO2 97 %.  GENERAL:  Well developed, well nourished, heavyset woman sitting comfortably on the medical unit in no acute distress. MENTAL STATUS:  Alert and oriented to person, place and time. HEAD:  Brown hair pulled up.  Normocephalic, atraumatic, face symmetric, no Cushingoid features. EYES:  Owens Shark  eyes.  Pupils equal round and reactive to light and accomodation.  No conjunctivitis or scleral icterus. ENT:  Oropharynx clear without lesion.  Tongue normal. Mucous membranes moist.  RESPIRATORY:  Clear to auscultation without rales, wheezes or rhonchi. CARDIOVASCULAR:  Regular rate and rhythm without murmur, rub or gallop. BREAST:  Large breasts.  Right  breast without masses, skin changes or nipple discharge.  Left breast without masses, skin changes or nipple discharge.  ABDOMEN:  Soft, slightly tender in the RUQ and epigastric region without guarding or rebound tenderness.  Active bowel sounds and no appreciable hepatosplenomegaly.  No masses. SKIN:  No rashes, ulcers or lesions. EXTREMITIES: ICDs in place.  No edema, no skin discoloration or tenderness.  No palpable cords. LYMPH NODES: No palpable cervical, supraclavicular, axillary or inguinal adenopathy  NEUROLOGICAL: Unremarkable. PSYCH:  Appropriate.  Results for orders placed or performed during the hospital encounter of 11/20/15 (from the past 48 hour(s))  CBC with Differential/Platelet     Status: Abnormal   Collection Time: 11/20/15  1:55 AM  Result Value Ref Range   WBC 8.7 3.6 - 11.0 K/uL   RBC 4.79 3.80 - 5.20 MIL/uL   Hemoglobin 14.6 12.0 - 16.0 g/dL   HCT 42.8 35.0 - 47.0 %   MCV 89.3 80.0 - 100.0 fL   MCH 30.5 26.0 - 34.0 pg   MCHC 34.2 32.0 - 36.0 g/dL   RDW 13.1 11.5 - 14.5 %   Platelets 260 150 - 440 K/uL    Comment: COUNT MAY BE INACCURATE DUE TO FIBRIN CLUMPS.   Neutrophils Relative % 49% %   Neutro Abs 4.2 1.4 - 6.5 K/uL   Lymphocytes Relative 38% %   Lymphs Abs 3.3 1.0 - 3.6 K/uL   Monocytes Relative 11% %   Monocytes Absolute 1.0 (H) 0.2 - 0.9 K/uL   Eosinophils Relative 2% %   Eosinophils Absolute 0.2 0 - 0.7 K/uL   Basophils Relative 0% %   Basophils Absolute 0.0 0 - 0.1 K/uL  Comprehensive metabolic panel     Status: Abnormal   Collection Time: 11/20/15  1:55 AM  Result Value Ref Range   Sodium 139 135 - 145 mmol/L   Potassium 3.9 3.5 - 5.1 mmol/L   Chloride 107 101 - 111 mmol/L   CO2 24 22 - 32 mmol/L   Glucose, Bld 107 (H) 65 - 99 mg/dL   BUN 12 6 - 20 mg/dL   Creatinine, Ser 0.57 0.44 - 1.00 mg/dL   Calcium 9.4 8.9 - 10.3 mg/dL   Total Protein 7.3 6.5 - 8.1 g/dL   Albumin 3.9 3.5 - 5.0 g/dL   AST 52 (H) 15 - 41 U/L   ALT 68 (H) 14 - 54 U/L    Alkaline Phosphatase 77 38 - 126 U/L   Total Bilirubin 0.5 0.3 - 1.2 mg/dL   GFR calc non Af Amer >60 >60 mL/min   GFR calc Af Amer >60 >60 mL/min    Comment: (NOTE) The eGFR has been calculated using the CKD EPI equation. This calculation has not been validated in all clinical situations. eGFR's persistently <60 mL/min signify possible Chronic Kidney Disease.    Anion gap 8 5 - 15  Lipase, blood     Status: None   Collection Time: 11/20/15  1:55 AM  Result Value Ref Range   Lipase 21 11 - 51 U/L  Troponin I     Status: None   Collection Time: 11/20/15  1:55 AM  Result  Value Ref Range   Troponin I <0.03 <0.031 ng/mL    Comment:        NO INDICATION OF MYOCARDIAL INJURY.   Urinalysis complete, with microscopic ([facility] only)     Status: Abnormal   Collection Time: 11/20/15  2:41 AM  Result Value Ref Range   Color, Urine COLORLESS (A) YELLOW   APPearance CLEAR (A) CLEAR   Glucose, UA NEGATIVE NEGATIVE mg/dL   Bilirubin Urine NEGATIVE NEGATIVE   Ketones, ur NEGATIVE NEGATIVE mg/dL   Specific Gravity, Urine 1.005 1.005 - 1.030   Hgb urine dipstick NEGATIVE NEGATIVE   pH 8.0 5.0 - 8.0   Protein, ur NEGATIVE NEGATIVE mg/dL   Nitrite NEGATIVE NEGATIVE   Leukocytes, UA TRACE (A) NEGATIVE   RBC / HPF NONE SEEN 0 - 5 RBC/hpf   WBC, UA 0-5 0 - 5 WBC/hpf   Bacteria, UA NONE SEEN NONE SEEN   Squamous Epithelial / LPF 0-5 (A) NONE SEEN  Pregnancy, urine POC     Status: None   Collection Time: 11/20/15  2:47 AM  Result Value Ref Range   Preg Test, Ur NEGATIVE NEGATIVE    Comment:        THE SENSITIVITY OF THIS METHODOLOGY IS >24 mIU/mL   APTT     Status: None   Collection Time: 11/20/15  6:07 AM  Result Value Ref Range   aPTT 31 24 - 36 seconds  Protime-INR     Status: None   Collection Time: 11/20/15  6:07 AM  Result Value Ref Range   Prothrombin Time 13.2 11.4 - 15.0 seconds   INR 0.98   Troponin I (q 6hr x 3)     Status: None   Collection Time: 11/20/15  7:22 AM   Result Value Ref Range   Troponin I <0.03 <0.031 ng/mL    Comment:        NO INDICATION OF MYOCARDIAL INJURY.   Heparin level (unfractionated)     Status: None   Collection Time: 11/20/15  1:02 PM  Result Value Ref Range   Heparin Unfractionated 0.64 0.30 - 0.70 IU/mL    Comment:        IF HEPARIN RESULTS ARE BELOW EXPECTED VALUES, AND PATIENT DOSAGE HAS BEEN CONFIRMED, SUGGEST FOLLOW UP TESTING OF ANTITHROMBIN III LEVELS.   Troponin I (q 6hr x 3)     Status: None   Collection Time: 11/20/15  1:02 PM  Result Value Ref Range   Troponin I <0.03 <0.031 ng/mL    Comment:        NO INDICATION OF MYOCARDIAL INJURY.   Heparin level (unfractionated)     Status: None   Collection Time: 11/20/15  7:36 PM  Result Value Ref Range   Heparin Unfractionated 0.59 0.30 - 0.70 IU/mL    Comment:        IF HEPARIN RESULTS ARE BELOW EXPECTED VALUES, AND PATIENT DOSAGE HAS BEEN CONFIRMED, SUGGEST FOLLOW UP TESTING OF ANTITHROMBIN III LEVELS.   Basic metabolic panel     Status: Abnormal   Collection Time: 11/21/15  4:10 AM  Result Value Ref Range   Sodium 137 135 - 145 mmol/L   Potassium 3.7 3.5 - 5.1 mmol/L   Chloride 107 101 - 111 mmol/L   CO2 23 22 - 32 mmol/L   Glucose, Bld 98 65 - 99 mg/dL   BUN 11 6 - 20 mg/dL   Creatinine, Ser 0.50 0.44 - 1.00 mg/dL   Calcium 8.3 (L) 8.9 - 10.3  mg/dL   GFR calc non Af Amer >60 >60 mL/min   GFR calc Af Amer >60 >60 mL/min    Comment: (NOTE) The eGFR has been calculated using the CKD EPI equation. This calculation has not been validated in all clinical situations. eGFR's persistently <60 mL/min signify possible Chronic Kidney Disease.    Anion gap 7 5 - 15  CBC     Status: None   Collection Time: 11/21/15  4:10 AM  Result Value Ref Range   WBC 7.0 3.6 - 11.0 K/uL   RBC 4.36 3.80 - 5.20 MIL/uL   Hemoglobin 13.3 12.0 - 16.0 g/dL   HCT 38.7 35.0 - 47.0 %   MCV 88.8 80.0 - 100.0 fL   MCH 30.6 26.0 - 34.0 pg   MCHC 34.4 32.0 - 36.0 g/dL    RDW 13.2 11.5 - 14.5 %   Platelets 247 150 - 440 K/uL  Hepatic function panel     Status: Abnormal   Collection Time: 11/21/15  4:10 AM  Result Value Ref Range   Total Protein 6.3 (L) 6.5 - 8.1 g/dL   Albumin 3.5 3.5 - 5.0 g/dL   AST 32 15 - 41 U/L   ALT 54 14 - 54 U/L   Alkaline Phosphatase 72 38 - 126 U/L   Total Bilirubin 0.7 0.3 - 1.2 mg/dL   Bilirubin, Direct <0.1 (L) 0.1 - 0.5 mg/dL   Indirect Bilirubin NOT CALCULATED 0.3 - 0.9 mg/dL  Heparin level (unfractionated)     Status: None   Collection Time: 11/21/15  4:10 AM  Result Value Ref Range   Heparin Unfractionated 0.55 0.30 - 0.70 IU/mL    Comment:        IF HEPARIN RESULTS ARE BELOW EXPECTED VALUES, AND PATIENT DOSAGE HAS BEEN CONFIRMED, SUGGEST FOLLOW UP TESTING OF ANTITHROMBIN III LEVELS.    Dg Chest 2 View  11/20/2015  CLINICAL DATA:  Acute onset of generalized chest pain, radiating to the back. Initial encounter. EXAM: CHEST  2 VIEW COMPARISON:  None. FINDINGS: The lungs are mildly hypoexpanded. Mild vascular congestion is noted. There is no evidence of focal opacification, pleural effusion or pneumothorax. The heart is normal in size; the mediastinal contour is within normal limits. No acute osseous abnormalities are seen. IMPRESSION: Lungs mildly hypoexpanded. Mild vascular congestion noted. Lungs remain grossly clear. Electronically Signed   By: Garald Balding M.D.   On: 11/20/2015 03:36   Ct Angio Chest Pe W/cm &/or Wo Cm  11/20/2015  CLINICAL DATA:  Severe chest pain with inspiration. Absent periods for 6 months and increasing abdominal distention. EXAM: CT ANGIOGRAPHY CHEST CT ABDOMEN AND PELVIS WITH CONTRAST TECHNIQUE: Multidetector CT imaging of the chest was performed using the standard protocol during bolus administration of intravenous contrast. Multiplanar CT image reconstructions and MIPs were obtained to evaluate the vascular anatomy. Multidetector CT imaging of the abdomen and pelvis was performed using the  standard protocol during bolus administration of intravenous contrast. CONTRAST:  100 cc Isovue 370 intravenous COMPARISON:  None. FINDINGS: CTA CHEST FINDINGS THORACIC INLET/BODY WALL: No acute abnormality. MEDIASTINUM: Normal heart size. No pericardial effusion. Evaluation of peripheral pulmonary arteries is limited by interruption of contrast. There is no evidence of pulmonary embolism. Calcified right hilar lymph nodes attributed to previous granulomatous disease. Small hiatal hernia with poor esophageal clearance or reflux. LUNG WINDOWS: There is no edema, consolidation, effusion, or pneumothorax. OSSEOUS: No acute fracture.  No suspicious lytic or blastic lesions. CT ABDOMEN and PELVIS FINDINGS Abdominal  wall:  No contributory findings. Hepatobiliary: Left portal vein thrombosis, beginning at the bifurcation, with vessel expansion and differential liver enhancement. No underlying inflammation or portal venous gas. No evidence of biliary obstruction or stone. Pancreas: Unremarkable. Spleen: Unremarkable. Adrenals/Urinary Tract: Negative adrenals. No hydronephrosis or stone. Unremarkable bladder. Reproductive:No pathologic findings.  Corpus luteum on the left. Stomach/Bowel:  No obstruction. No appendicitis. Vascular/Lymphatic: No acute vascular abnormality. No mass or adenopathy. Peritoneal: No ascites or pneumoperitoneum. Musculoskeletal: Negative. Review of the MIP images confirms the above findings. IMPRESSION: 1. Left portal vein thrombosis. 2. No evidence of pulmonary embolism or other acute cardiopulmonary disease. Electronically Signed   By: Monte Fantasia M.D.   On: 11/20/2015 05:51   Ct Abdomen Pelvis W Contrast  11/20/2015  CLINICAL DATA:  Severe chest pain with inspiration. Absent periods for 6 months and increasing abdominal distention. EXAM: CT ANGIOGRAPHY CHEST CT ABDOMEN AND PELVIS WITH CONTRAST TECHNIQUE: Multidetector CT imaging of the chest was performed using the standard protocol during  bolus administration of intravenous contrast. Multiplanar CT image reconstructions and MIPs were obtained to evaluate the vascular anatomy. Multidetector CT imaging of the abdomen and pelvis was performed using the standard protocol during bolus administration of intravenous contrast. CONTRAST:  100 cc Isovue 370 intravenous COMPARISON:  None. FINDINGS: CTA CHEST FINDINGS THORACIC INLET/BODY WALL: No acute abnormality. MEDIASTINUM: Normal heart size. No pericardial effusion. Evaluation of peripheral pulmonary arteries is limited by interruption of contrast. There is no evidence of pulmonary embolism. Calcified right hilar lymph nodes attributed to previous granulomatous disease. Small hiatal hernia with poor esophageal clearance or reflux. LUNG WINDOWS: There is no edema, consolidation, effusion, or pneumothorax. OSSEOUS: No acute fracture.  No suspicious lytic or blastic lesions. CT ABDOMEN and PELVIS FINDINGS Abdominal wall:  No contributory findings. Hepatobiliary: Left portal vein thrombosis, beginning at the bifurcation, with vessel expansion and differential liver enhancement. No underlying inflammation or portal venous gas. No evidence of biliary obstruction or stone. Pancreas: Unremarkable. Spleen: Unremarkable. Adrenals/Urinary Tract: Negative adrenals. No hydronephrosis or stone. Unremarkable bladder. Reproductive:No pathologic findings.  Corpus luteum on the left. Stomach/Bowel:  No obstruction. No appendicitis. Vascular/Lymphatic: No acute vascular abnormality. No mass or adenopathy. Peritoneal: No ascites or pneumoperitoneum. Musculoskeletal: Negative. Review of the MIP images confirms the above findings. IMPRESSION: 1. Left portal vein thrombosis. 2. No evidence of pulmonary embolism or other acute cardiopulmonary disease. Electronically Signed   By: Monte Fantasia M.D.   On: 11/20/2015 05:51   US Venous Img Lower Bilateral  11/20/2015  CLINICAL DATA:  40 year old female EXAM: BILATERAL LOWER  EXTREMITY VENOUS DOPPLER ULTRASOUND TECHNIQUE: Gray-scale sonography with graded compression, as well as color Doppler and duplex ultrasound were performed to evaluate the lower extremity deep venous systems from the level of the common femoral vein and including the common femoral, femoral, profunda femoral, popliteal and calf veins including the posterior tibial, peroneal and gastrocnemius veins when visible. The superficial great saphenous vein was also interrogated. Spectral Doppler was utilized to evaluate flow at rest and with distal augmentation maneuvers in the common femoral, femoral and popliteal veins. COMPARISON:  None. FINDINGS: RIGHT LOWER EXTREMITY Common Femoral Vein: No evidence of thrombus. Normal compressibility, respiratory phasicity and response to augmentation. Saphenofemoral Junction: No evidence of thrombus. Normal compressibility and flow on color Doppler imaging. Profunda Femoral Vein: No evidence of thrombus. Normal compressibility and flow on color Doppler imaging. Femoral Vein: No evidence of thrombus. Normal compressibility, respiratory phasicity and response to augmentation. Popliteal Vein: No evidence of thrombus. Normal  compressibility, respiratory phasicity and response to augmentation. Calf Veins: No evidence of thrombus. Normal compressibility and flow on color Doppler imaging. Superficial Great Saphenous Vein: No evidence of thrombus. Normal compressibility and flow on color Doppler imaging. Venous Reflux:  None. Other Findings:  None. LEFT LOWER EXTREMITY Common Femoral Vein: No evidence of thrombus. Normal compressibility, respiratory phasicity and response to augmentation. Saphenofemoral Junction: No evidence of thrombus. Normal compressibility and flow on color Doppler imaging. Profunda Femoral Vein: No evidence of thrombus. Normal compressibility and flow on color Doppler imaging. Femoral Vein: No evidence of thrombus. Normal compressibility, respiratory phasicity and  response to augmentation. Popliteal Vein: No evidence of thrombus. Normal compressibility, respiratory phasicity and response to augmentation. Calf Veins: No evidence of thrombus. Normal compressibility and flow on color Doppler imaging. Superficial Great Saphenous Vein: No evidence of thrombus. Normal compressibility and flow on color Doppler imaging. Venous Reflux:  None. Other Findings:  None. IMPRESSION: No evidence of deep venous thrombosis. Electronically Signed   By: Jacqulynn Cadet M.D.   On: 11/20/2015 16:22   US Abdomen Limited Ruq  11/20/2015  CLINICAL DATA:  Right upper quadrant and epigastric pain with nausea and vomiting EXAM: US ABDOMEN LIMITED - RIGHT UPPER QUADRANT COMPARISON:  None. FINDINGS: Gallbladder: No gallstones or wall thickening visualized. No sonographic Murphy sign noted by sonographer. Common bile duct: Diameter: 4 mm.  Where visualized, no filling defect. Liver: No focal lesion identified. Within normal limits in parenchymal echogenicity. Antegrade flow in the imaged portal venous system. IMPRESSION: Normal study. Electronically Signed   By: Monte Fantasia M.D.   On: 11/20/2015 04:00    Assessment:  The patient is a 40 y.o. woman with left portal vein thrombosis of unknown etiology.  She denies smoking or use of birth control pills.  She has had no recent abdominal surgeries or trauma.  She does not give a history of inflammatory bowel disease.  She has been active.  Labs reveal no evidence of pancreatitis.  She is not pregnant.  Scans reveal no evidence of malignancy.  She has no family history of any blood disorders or thrombosis.  Plan:   1.  Discuss with patient diagnosis of portal vein thrombosis, etiologies, work-up, and treatment.  Discuss at least 6 months of anticoagulation.  Discuss Coumadin versus Xa inhibitors.  Discuss plan for follow-up in the outpatient department. 2.  Hypercoagulable work-up ordered:  Factor V Leiden, prothrombin gene mutation, lupus  anticoagulant panel, anti-cardiolipin antibodies, beta2-glycoprotein, JAK2, PNH by flow cytometry, protein C, protein S, AT-III.  3.  Anticoagulation with heparin and conversion to Coumadin or switch to Xa inhibitor (Xarelto or Eliquis). 4.  Social work to assist with medication coverage. 5.  Follow-up in the outpatient department in 1 week.  Thank you for allowing me to participate in Maylani Embree 's care.  I will follow her closely with you while hospitalized and after discharge in the outpatient department.   Lequita Asal, MD  11/21/2015, 1:39 PM

## 2015-11-21 NOTE — Progress Notes (Signed)
Oncology:  Spanish interpreter requested for 8AM on 11/21/2015.  Hypercoagulable work-up ordered.   Rosey BathMelissa C Corcoran, MD

## 2015-11-21 NOTE — Progress Notes (Signed)
[facility] [place] [facility]   PATIENT NAME: Kristina Eaton    MR#:  [id number]  DATE OF BIRTH:  11-05-1975  SUBJECTIVE:  CHIEF COMPLAINT:   Chief Complaint  Patient presents with  . Chest Pain   The pt complains of diffuse abd pain and weakness. REVIEW OF SYSTEMS:  CONSTITUTIONAL: No fever, Has generalized weakness.  EYES: No blurred or double vision.  EARS, NOSE, AND THROAT: No tinnitus or ear pain.  RESPIRATORY: No cough, shortness of breath, wheezing or hemoptysis.  CARDIOVASCULAR: No chest pain, orthopnea, edema.  GASTROINTESTINAL: No nausea, vomiting, diarrhea but has abdominal pain.  GENITOURINARY: No dysuria, hematuria.  ENDOCRINE: No polyuria, nocturia,  HEMATOLOGY: No anemia, easy bruising or bleeding SKIN: No rash or lesion. MUSCULOSKELETAL: No joint pain or arthritis.   NEUROLOGIC: No tingling, numbness, weakness.  PSYCHIATRY: No anxiety or depression.   DRUG ALLERGIES:  No Known Allergies  VITALS:  Blood pressure 106/61, pulse 74, temperature 98.2 F (36.8 C), temperature source Oral, resp. rate 18, height  (1.549 m), weight 90.719 kg (200 lb), last menstrual period 05/22/2015, SpO2 100 %.  PHYSICAL EXAMINATION:  GENERAL:  40 y.o.-year-old patient lying in the bed with no acute distress. Obese. EYES: Pupils equal, round, reactive to light and accommodation. No scleral icterus. Extraocular muscles intact.  HEENT: Head atraumatic, normocephalic. Oropharynx and nasopharynx clear.  NECK:  Supple, no jugular venous distention. No thyroid enlargement, no tenderness.  LUNGS: Normal breath sounds bilaterally, no wheezing, rales,rhonchi or crepitation. No use of accessory muscles of respiration.  CARDIOVASCULAR: S1, S2 normal. No murmurs, rubs, or gallops.  ABDOMEN: Soft, Diffuse tenderness, nondistended. Bowel sounds present. No organomegaly or mass.  EXTREMITIES: No pedal edema, cyanosis, or clubbing. No calf  tenderness. NEUROLOGIC: Cranial nerves II through XII are intact. Muscle strength 5/5 in all extremities. Sensation intact. Gait not checked.  PSYCHIATRIC: The patient is alert and oriented x 3.  SKIN: No obvious rash, lesion, or ulcer.    LABORATORY PANEL:   CBC  Recent Labs Lab 11/21/15 0410  WBC 7.0  HGB 13.3  HCT 38.7  PLT 247   ------------------------------------------------------------------------------------------------------------------  Chemistries   Recent Labs Lab 11/21/15 0410  NA 137  K 3.7  CL 107  CO2 23  GLUCOSE 98  BUN 11  CREATININE 0.50  CALCIUM 8.3*  AST 32  ALT 54  ALKPHOS 72  BILITOT 0.7   ------------------------------------------------------------------------------------------------------------------  Cardiac Enzymes  Recent Labs Lab 11/20/15 1302  TROPONINI <0.03   ------------------------------------------------------------------------------------------------------------------  RADIOLOGY:  Dg Chest 2 View  11/20/2015  CLINICAL DATA:  Acute onset of generalized chest pain, radiating to the back. Initial encounter. EXAM: CHEST  2 VIEW COMPARISON:  None. FINDINGS: The lungs are mildly hypoexpanded. Mild vascular congestion is noted. There is no evidence of focal opacification, pleural effusion or pneumothorax. The heart is normal in size; the mediastinal contour is within normal limits. No acute osseous abnormalities are seen. IMPRESSION: Lungs mildly hypoexpanded. Mild vascular congestion noted. Lungs remain grossly clear. Electronically Signed   By: Roanna Raider M.D.   On: 11/20/2015 03:36   Ct Angio Chest Pe W/cm &/or Wo Cm  11/20/2015  CLINICAL DATA:  Severe chest pain with inspiration. Absent periods for 6 months and increasing abdominal distention. EXAM: CT ANGIOGRAPHY CHEST CT ABDOMEN AND PELVIS WITH CONTRAST TECHNIQUE: Multidetector CT imaging of the chest was performed using the standard protocol during bolus administration of  intravenous contrast. Multiplanar CT image reconstructions and MIPs were obtained  to evaluate the vascular anatomy. Multidetector CT imaging of the abdomen and pelvis was performed using the standard protocol during bolus administration of intravenous contrast. CONTRAST:  100 cc Isovue 370 intravenous COMPARISON:  None. FINDINGS: CTA CHEST FINDINGS THORACIC INLET/BODY WALL: No acute abnormality. MEDIASTINUM: Normal heart size. No pericardial effusion. Evaluation of peripheral pulmonary arteries is limited by interruption of contrast. There is no evidence of pulmonary embolism. Calcified right hilar lymph nodes attributed to previous granulomatous disease. Small hiatal hernia with poor esophageal clearance or reflux. LUNG WINDOWS: There is no edema, consolidation, effusion, or pneumothorax. OSSEOUS: No acute fracture.  No suspicious lytic or blastic lesions. CT ABDOMEN and PELVIS FINDINGS Abdominal wall:  No contributory findings. Hepatobiliary: Left portal vein thrombosis, beginning at the bifurcation, with vessel expansion and differential liver enhancement. No underlying inflammation or portal venous gas. No evidence of biliary obstruction or stone. Pancreas: Unremarkable. Spleen: Unremarkable. Adrenals/Urinary Tract: Negative adrenals. No hydronephrosis or stone. Unremarkable bladder. Reproductive:No pathologic findings.  Corpus luteum on the left. Stomach/Bowel:  No obstruction. No appendicitis. Vascular/Lymphatic: No acute vascular abnormality. No mass or adenopathy. Peritoneal: No ascites or pneumoperitoneum. Musculoskeletal: Negative. Review of the MIP images confirms the above findings. IMPRESSION: 1. Left portal vein thrombosis. 2. No evidence of pulmonary embolism or other acute cardiopulmonary disease. Electronically Signed   By: Marnee Spring M.D.   On: 11/20/2015 05:51   Ct Abdomen Pelvis W Contrast  11/20/2015  CLINICAL DATA:  Severe chest pain with inspiration. Absent periods for 6 months and  increasing abdominal distention. EXAM: CT ANGIOGRAPHY CHEST CT ABDOMEN AND PELVIS WITH CONTRAST TECHNIQUE: Multidetector CT imaging of the chest was performed using the standard protocol during bolus administration of intravenous contrast. Multiplanar CT image reconstructions and MIPs were obtained to evaluate the vascular anatomy. Multidetector CT imaging of the abdomen and pelvis was performed using the standard protocol during bolus administration of intravenous contrast. CONTRAST:  100 cc Isovue 370 intravenous COMPARISON:  None. FINDINGS: CTA CHEST FINDINGS THORACIC INLET/BODY WALL: No acute abnormality. MEDIASTINUM: Normal heart size. No pericardial effusion. Evaluation of peripheral pulmonary arteries is limited by interruption of contrast. There is no evidence of pulmonary embolism. Calcified right hilar lymph nodes attributed to previous granulomatous disease. Small hiatal hernia with poor esophageal clearance or reflux. LUNG WINDOWS: There is no edema, consolidation, effusion, or pneumothorax. OSSEOUS: No acute fracture.  No suspicious lytic or blastic lesions. CT ABDOMEN and PELVIS FINDINGS Abdominal wall:  No contributory findings. Hepatobiliary: Left portal vein thrombosis, beginning at the bifurcation, with vessel expansion and differential liver enhancement. No underlying inflammation or portal venous gas. No evidence of biliary obstruction or stone. Pancreas: Unremarkable. Spleen: Unremarkable. Adrenals/Urinary Tract: Negative adrenals. No hydronephrosis or stone. Unremarkable bladder. Reproductive:No pathologic findings.  Corpus luteum on the left. Stomach/Bowel:  No obstruction. No appendicitis. Vascular/Lymphatic: No acute vascular abnormality. No mass or adenopathy. Peritoneal: No ascites or pneumoperitoneum. Musculoskeletal: Negative. Review of the MIP images confirms the above findings. IMPRESSION: 1. Left portal vein thrombosis. 2. No evidence of pulmonary embolism or other acute  cardiopulmonary disease. Electronically Signed   By: Marnee Spring M.D.   On: 11/20/2015 05:51   US Venous Img Lower Bilateral  11/20/2015  CLINICAL DATA:  40 year old female EXAM: BILATERAL LOWER EXTREMITY VENOUS DOPPLER ULTRASOUND TECHNIQUE: Gray-scale sonography with graded compression, as well as color Doppler and duplex ultrasound were performed to evaluate the lower extremity deep venous systems from the level of the common femoral vein and including the common femoral, femoral, profunda femoral,  popliteal and calf veins including the posterior tibial, peroneal and gastrocnemius veins when visible. The superficial great saphenous vein was also interrogated. Spectral Doppler was utilized to evaluate flow at rest and with distal augmentation maneuvers in the common femoral, femoral and popliteal veins. COMPARISON:  None. FINDINGS: RIGHT LOWER EXTREMITY Common Femoral Vein: No evidence of thrombus. Normal compressibility, respiratory phasicity and response to augmentation. Saphenofemoral Junction: No evidence of thrombus. Normal compressibility and flow on color Doppler imaging. Profunda Femoral Vein: No evidence of thrombus. Normal compressibility and flow on color Doppler imaging. Femoral Vein: No evidence of thrombus. Normal compressibility, respiratory phasicity and response to augmentation. Popliteal Vein: No evidence of thrombus. Normal compressibility, respiratory phasicity and response to augmentation. Calf Veins: No evidence of thrombus. Normal compressibility and flow on color Doppler imaging. Superficial Great Saphenous Vein: No evidence of thrombus. Normal compressibility and flow on color Doppler imaging. Venous Reflux:  None. Other Findings:  None. LEFT LOWER EXTREMITY Common Femoral Vein: No evidence of thrombus. Normal compressibility, respiratory phasicity and response to augmentation. Saphenofemoral Junction: No evidence of thrombus. Normal compressibility and flow on color Doppler imaging.  Profunda Femoral Vein: No evidence of thrombus. Normal compressibility and flow on color Doppler imaging. Femoral Vein: No evidence of thrombus. Normal compressibility, respiratory phasicity and response to augmentation. Popliteal Vein: No evidence of thrombus. Normal compressibility, respiratory phasicity and response to augmentation. Calf Veins: No evidence of thrombus. Normal compressibility and flow on color Doppler imaging. Superficial Great Saphenous Vein: No evidence of thrombus. Normal compressibility and flow on color Doppler imaging. Venous Reflux:  None. Other Findings:  None. IMPRESSION: No evidence of deep venous thrombosis. Electronically Signed   By: Malachy MoanHeath  McCullough M.D.   On: 11/20/2015 16:22   [place] Abdomen Limited Ruq  11/20/2015  CLINICAL DATA:  Right upper quadrant and epigastric pain with nausea and vomiting EXAM: US ABDOMEN LIMITED - RIGHT UPPER QUADRANT COMPARISON:  None. FINDINGS: Gallbladder: No gallstones or wall thickening visualized. No sonographic Murphy sign noted by sonographer. Common bile duct: Diameter: 4 mm.  Where visualized, no filling defect. Liver: No focal lesion identified. Within normal limits in parenchymal echogenicity. Antegrade flow in the imaged portal venous system. IMPRESSION: Normal study. Electronically Signed   By: Marnee SpringJonathon  Watts M.D.   On: 11/20/2015 04:00    EKG:   Orders placed or performed during the hospital encounter of 11/20/15  . EKG 12-Lead  . EKG 12-Lead     ASSESSMENT AND PLAN:  40 year old female patient with no significant past medical history presented to the emergency room with abdominal pain and chest discomfort.  Left portal vein thrombosis. Continue heparin drip. F/u hematology consult.  Transaminitis. Improved.  B/l calf tenderness. Improved. Venous US showed no DVT.  With help of spanish interpretor.     All the records are reviewed and case discussed with Care Management/Social Workerr. Management plans discussed  with the patient, family and they are in agreement.  CODE STATUS: Full code  TOTAL TIME TAKING CARE OF THIS PATIENT: 36 minutes.  Greater than 50% time was spent on coordination of care and face-to-face counseling.  POSSIBLE D/C IN 2 DAYS, DEPENDING ON CLINICAL CONDITION.   Shaune Pollackhen, Lydia Meng M.D on 11/21/2015 at 1:31 PM  Between 7am to 6pm - Pager - [phone]  After 6pm go to [email or web address] - password [email or web address] [facility]  [facility]  Office  [phone]  CC: Primary care physician; No PCP Per Patient

## 2015-11-22 ENCOUNTER — Encounter: Payer: Self-pay | Admitting: Radiology

## 2015-11-22 ENCOUNTER — Inpatient Hospital Stay: Payer: MEDICAID

## 2015-11-22 DIAGNOSIS — R0781 Pleurodynia: Secondary | ICD-10-CM

## 2015-11-22 DIAGNOSIS — E669 Obesity, unspecified: Secondary | ICD-10-CM

## 2015-11-22 DIAGNOSIS — R0600 Dyspnea, unspecified: Secondary | ICD-10-CM

## 2015-11-22 LAB — CBC
HEMATOCRIT: 39.5 % (ref 35.0–47.0)
HEMOGLOBIN: 13.7 g/dL (ref 12.0–16.0)
MCH: 30.9 pg (ref 26.0–34.0)
MCHC: 34.6 g/dL (ref 32.0–36.0)
MCV: 89.4 fL (ref 80.0–100.0)
Platelets: 271 10*3/uL (ref 150–440)
RBC: 4.42 MIL/uL (ref 3.80–5.20)
RDW: 13 % (ref 11.5–14.5)
WBC: 6.8 10*3/uL (ref 3.6–11.0)

## 2015-11-22 LAB — HEPARIN LEVEL (UNFRACTIONATED): Heparin Unfractionated: 0.56 IU/mL (ref 0.30–0.70)

## 2015-11-22 MED ORDER — APIXABAN 5 MG PO TABS
10.0000 mg | ORAL_TABLET | Freq: Two times a day (BID) | ORAL | Status: DC
  Administered 2015-11-22: 10 mg via ORAL
  Filled 2015-11-22: qty 2

## 2015-11-22 MED ORDER — IOPAMIDOL (ISOVUE-370) INJECTION 76%
75.0000 mL | Freq: Once | INTRAVENOUS | Status: AC | PRN
  Administered 2015-11-22: 75 mL via INTRAVENOUS

## 2015-11-22 MED ORDER — HYDROCODONE-ACETAMINOPHEN 5-325 MG PO TABS: 1.0000 | ORAL_TABLET | ORAL | Status: AC | PRN

## 2015-11-22 MED ORDER — APIXABAN 5 MG PO TABS: 5.0000 mg | ORAL_TABLET | Freq: Two times a day (BID) | ORAL | Status: DC

## 2015-11-22 MED ORDER — APIXABAN 5 MG PO TABS: 10.0000 mg | ORAL_TABLET | Freq: Two times a day (BID) | ORAL | Status: DC

## 2015-11-22 NOTE — Progress Notes (Signed)
Patient discharge home. Discharge patient instructions given via utilized interpretor patient verbalized her understanding of plan of care.

## 2015-11-22 NOTE — Discharge Summary (Signed)
[facility] [place] at [facility]   PATIENT NAME: Kristina Eaton    MR#:  [id number]  DATE OF BIRTH:  1975-09-12  DATE OF ADMISSION:  11/20/2015 ADMITTING PHYSICIAN: Ihor Austin, MD  DATE OF DISCHARGE: No discharge date for patient encounter.  PRIMARY CARE PHYSICIAN: No PCP Per Patient     ADMISSION DIAGNOSIS:  Portal vein thrombosis [I81] Abdominal pain, unspecified abdominal location [R10.9] Non-intractable vomiting with nausea, vomiting of unspecified type [R11.2]  DISCHARGE DIAGNOSIS:  Principal Problem:   Portal vein thrombosis Active Problems:   Abdominal pain   Dyspnea   Pleuritic chest pain   Obesity   SECONDARY DIAGNOSIS:  History reviewed. No pertinent past medical history.  .pro HOSPITAL COURSE:   Patient is 40 year old Spanish female with no significant past medical history who presents to the hospital with complaints of chest pain and abdominal pain. Abdominal pain was located in the epigastrium and the right upper quadrant and was described as sharp. Patient underwent ultrasound of abdomen which revealed no gallstones. CT scan of abdomen and pelvis with contrast showed left portal vein thrombosis, but no evidence of pulmonary embolism or other acute cardiopulmonary disease. Lower extremity ultrasound showed no DVT. Patient was seen by oncologist Dr. Merlene Pulling and hypercoagulable workup was initiated. Dr. Merlene Pulling is recommended to initiate antifungal correlation therapy, Eliquis was started on the day of discharge. Patient was advised to follow-up with Dr. Merlene Pulling for hypercoagulable workup results in about 1 week after discharge Discussion by problem . #1, portal vein thrombosis, continue patient on Eliquis, initiate loading dose upon discharge. The patient is to continue anticoagulation for about 6 months or longer if needed. Patient is to follow-up with Dr. Merlene Pulling for hypercoagulable workup results in about 1 week after  discharge. #2. Chest pain, likely due to abdominal process, cardiac enzymes were negative, appreciate his evaluation revealed tenderness on palpation of her anterior chest, representing discomfort she came in with, unlikely cardiac issue. Patient was advised to continue ibuprofen as needed   #3 elevated transaminases, resolved on repeat testing, likely reactive due to portal vein thrombosis #4. Dyspnea, etiology remains unclear, no PE on CT scanning, although cannot rule out tiny pulmonary embolism, for which patient will be receiving anticoagulation with Eliquis. Chest x-ray on admission showed mild vascular congestion, patient received IV fluids while in the hospital, discontinued now.  Oxygenation remains satisfactory. Follow-up and get echocardiogram as outpatient if needed.   DISCHARGE CONDITIONS:   Stable   CONSULTS OBTAINED:  Treatment Team:  Rosey Bath, MD  DRUG ALLERGIES:  No Known Allergies  DISCHARGE MEDICATIONS:   Current Discharge Medication List    START taking these medications   Details  !! apixaban (ELIQUIS) 5 MG TABS tablet Take 2 tablets (10 mg total) by mouth 2 (two) times daily. Qty: 14 tablet, Refills: 0    !! apixaban (ELIQUIS) 5 MG TABS tablet Take 1 tablet (5 mg total) by mouth 2 (two) times daily. Qty: 60 tablet, Refills: 6     !! - Potential duplicate medications found. Please discuss with provider.       DISCHARGE INSTRUCTIONS:    Patient is to follow-up with Dr. Merlene Pulling, oncologist in about 1 week after discharge   If you experience worsening of your admission symptoms, develop shortness of breath, life threatening emergency, suicidal or homicidal thoughts you must seek medical attention immediately by calling 911 or calling your MD immediately  if symptoms less severe.  You Must read complete instructions/literature along with  all the possible adverse reactions/side effects for all the Medicines you take and that have been prescribed to  you. Take any new Medicines after you have completely understood and accept all the possible adverse reactions/side effects.   Please note  You were cared for by a hospitalist during your hospital stay. If you have any questions about your discharge medications or the care you received while you were in the hospital after you are discharged, you can call the unit and asked to speak with the hospitalist on call if the hospitalist that took care of you is not available. Once you are discharged, your primary care physician will handle any further medical issues. Please note that NO REFILLS for any discharge medications will be authorized once you are discharged, as it is imperative that you return to your primary care physician (or establish a relationship with a primary care physician if you do not have one) for your aftercare needs so that they can reassess your need for medications and monitor your lab values.    Today   CHIEF COMPLAINT:   Chief Complaint  Patient presents with  . Chest Pain    HISTORY OF PRESENT ILLNESS:  Kristina Eaton  is a 40 y.o. female with no significant past medical history who presents to the hospital with complaints of chest pain and abdominal pain. Abdominal pain was located in the epigastrium and the right upper quadrant and was described as sharp. Patient underwent ultrasound of abdomen which revealed no gallstones. CT scan of abdomen and pelvis with contrast showed left portal vein thrombosis, but no evidence of pulmonary embolism or other acute cardiopulmonary disease. Lower extremity ultrasound showed no DVT. Patient was seen by oncologist Dr. Merlene Pullingorcoran and hypercoagulable workup was initiated. Dr. Merlene Pullingorcoran is recommended to initiate antifungal correlation therapy, Eliquis was started on the day of discharge. Patient was advised to follow-up with Dr. Merlene Pullingorcoran for hypercoagulable workup results in about 1 week after discharge Discussion by problem . #1, portal vein  thrombosis, continue patient on Eliquis, initiate loading dose upon discharge. The patient is to continue anticoagulation for about 6 months or longer if needed. Patient is to follow-up with Dr. Merlene Pullingorcoran for hypercoagulable workup results in about 1 week after discharge. #2. Chest pain, likely due to abdominal process, cardiac enzymes were negative, appreciate his evaluation revealed tenderness on palpation of her anterior chest, representing discomfort she came in with, unlikely cardiac issue. Patient was advised to continue ibuprofen as needed   #3 elevated transaminases, resolved on repeat testing, likely reactive due to portal vein thrombosis #4. Dyspnea, etiology remains unclear, no PE on CT scanning, although cannot rule out tiny pulmonary embolism, for which patient will be receiving anticoagulation with Eliquis. Chest x-ray on admission showed mild vascular congestion, patient received IV fluids while in the hospital, discontinued now.  Oxygenation remains satisfactory. Follow-up and get echocardiogram as outpatient if needed.    VITAL SIGNS:  Blood pressure 106/61, pulse 72, temperature 99.2 F (37.3 C), temperature source Oral, resp. rate 18, height 5\' 1"  (1.549 m), weight 90.719 kg (200 lb), last menstrual period 05/22/2015, SpO2 98 %.  I/O:   Intake/Output Summary (Last 24 hours) at 11/22/15 1152 Last data filed at 11/22/15 0800  Gross per 24 hour  Intake    780 ml  Output      0 ml  Net    780 ml    PHYSICAL EXAMINATION:  GENERAL:  40 y.o.-year-old , Obese patient lying in the bed with no  acute distress.  EYES: Pupils equal, round, reactive to light and accommodation. No scleral icterus. Extraocular muscles intact.  HEENT: Head atraumatic, normocephalic. Oropharynx and nasopharynx clear.  NECK:  Supple, no jugular venous distention. No thyroid enlargement, no tenderness.  LUNGS: Normal breath sounds bilaterally, no wheezing, rales,rhonchi or crepitation. No use of accessory  muscles of respiration.  CARDIOVASCULAR: S1, S2 normal. No murmurs, rubs, or gallops.  ABDOMEN: Soft, non-tender, non-distended. Bowel sounds present. No organomegaly or mass.  EXTREMITIES: No pedal edema, cyanosis, or clubbing.  NEUROLOGIC: Cranial nerves II through XII are intact. Muscle strength 5/5 in all extremities. Sensation intact. Gait not checked.  PSYCHIATRIC: The patient is alert and oriented x 3.  SKIN: No obvious rash, lesion, or ulcer.   DATA REVIEW:   CBC  Recent Labs Lab 11/22/15 0514  WBC 6.8  HGB 13.7  HCT 39.5  PLT 271    Chemistries   Recent Labs Lab 11/21/15 0410  NA 137  K 3.7  CL 107  CO2 23  GLUCOSE 98  BUN 11  CREATININE 0.50  CALCIUM 8.3*  AST 32  ALT 54  ALKPHOS 72  BILITOT 0.7    Cardiac Enzymes  Recent Labs Lab 11/20/15 1302  TROPONINI <0.03    Microbiology Results  Results for orders placed or performed in visit on 12/10/11  Beta Strep Culture [facility])     Status: None   Collection Time: 12/10/11  9:52 AM  Result Value Ref Range Status   Micro Text Report   Final       COMMENT                   NO BETA STREPTOCOCCUS ISOLATED IN 48 HOURS   ANTIBIOTIC                                                        RADIOLOGY:  Ct Angio Chest Pe W/cm &/or Wo Cm  11/22/2015  CLINICAL DATA:  Increase shortness of breath with new onset chest pain this morning, left portal vein thrombosis EXAM: CT ANGIOGRAPHY CHEST WITH CONTRAST TECHNIQUE: Multidetector CT imaging of the chest was performed using the standard protocol during bolus administration of intravenous contrast. Multiplanar CT image reconstructions and MIPs were obtained to evaluate the vascular anatomy. CONTRAST:  75 mL Isovue 370 COMPARISON:  11/20/2015 FINDINGS: No significant pulmonary parenchymal opacification. No pleural effusion. No significant hilar or mediastinal mass.  No pericardial effusion. Thoracic aorta shows no dissection or dilatation. There are no filling defects in  the pulmonary arterial system. No acute abnormalities in the upper abdomen.  Small hiatal hernia. No acute musculoskeletal findings. Review of the MIP images confirms the above findings. IMPRESSION: No evidence of pulmonary arterial embolism. Electronically Signed   By: Esperanza Heir M.D.   On: 11/22/2015 10:40   US Venous Img Lower Bilateral  11/20/2015  CLINICAL DATA:  40 year old female EXAM: BILATERAL LOWER EXTREMITY VENOUS DOPPLER ULTRASOUND TECHNIQUE: Gray-scale sonography with graded compression, as well as color Doppler and duplex ultrasound were performed to evaluate the lower extremity deep venous systems from the level of the common femoral vein and including the common femoral, femoral, profunda femoral, popliteal and calf veins including the posterior tibial, peroneal and gastrocnemius veins when visible. The superficial great saphenous vein was also interrogated. Spectral Doppler was utilized  to evaluate flow at rest and with distal augmentation maneuvers in the common femoral, femoral and popliteal veins. COMPARISON:  None. FINDINGS: RIGHT LOWER EXTREMITY Common Femoral Vein: No evidence of thrombus. Normal compressibility, respiratory phasicity and response to augmentation. Saphenofemoral Junction: No evidence of thrombus. Normal compressibility and flow on color Doppler imaging. Profunda Femoral Vein: No evidence of thrombus. Normal compressibility and flow on color Doppler imaging. Femoral Vein: No evidence of thrombus. Normal compressibility, respiratory phasicity and response to augmentation. Popliteal Vein: No evidence of thrombus. Normal compressibility, respiratory phasicity and response to augmentation. Calf Veins: No evidence of thrombus. Normal compressibility and flow on color Doppler imaging. Superficial Great Saphenous Vein: No evidence of thrombus. Normal compressibility and flow on color Doppler imaging. Venous Reflux:  None. Other Findings:  None. LEFT LOWER EXTREMITY Common  Femoral Vein: No evidence of thrombus. Normal compressibility, respiratory phasicity and response to augmentation. Saphenofemoral Junction: No evidence of thrombus. Normal compressibility and flow on color Doppler imaging. Profunda Femoral Vein: No evidence of thrombus. Normal compressibility and flow on color Doppler imaging. Femoral Vein: No evidence of thrombus. Normal compressibility, respiratory phasicity and response to augmentation. Popliteal Vein: No evidence of thrombus. Normal compressibility, respiratory phasicity and response to augmentation. Calf Veins: No evidence of thrombus. Normal compressibility and flow on color Doppler imaging. Superficial Great Saphenous Vein: No evidence of thrombus. Normal compressibility and flow on color Doppler imaging. Venous Reflux:  None. Other Findings:  None. IMPRESSION: No evidence of deep venous thrombosis. Electronically Signed   By: Malachy Moan M.D.   On: 11/20/2015 16:22    EKG:   Orders placed or performed during the hospital encounter of 11/20/15  . EKG 12-Lead  . EKG 12-Lead      Management plans discussed with the patient, family and they are in agreement.  CODE STATUS:     Code Status Orders        Start     Ordered   11/20/15 0825  Full code   Continuous     11/20/15 0824    Code Status History    Date Active Date Inactive Code Status Order ID Comments User Context   This patient has a current code status but no historical code status.      TOTAL TIME TAKING CARE OF THIS PATIENT: 40 minutes   discussed via Spanish interpreter  Emie Sommerfeld M.D on 11/22/2015 at 11:52 AM  Between 7am to 6pm - Pager - [phone]  After 6pm go to [email or web address] - password [email or web address]  [facility]  Office  [phone]  CC: Primary care physician; No PCP Per Patient

## 2015-11-22 NOTE — Consult Note (Signed)
ANTICOAGULATION CONSULT NOTE - Follow Up Consult  Pharmacy Consult for Apixaban Indication: Portal Vein Thrombosis  No Known Allergies  Patient Measurements: Height: 5\' 1"  (154.9 cm) Weight: 200 lb (90.719 kg) IBW/kg (Calculated) : 47.8  Vital Signs: Temp: 99.2 F (37.3 C) (04/29 0719) Temp Source: Oral (04/29 0719) BP: 106/61 mmHg (04/29 0719) Pulse Rate: 72 (04/29 0719)  Labs:  Recent Labs  11/20/15 0155 11/20/15 [id number] 11/20/15 0722  11/20/15 1302 11/20/15 1936 11/21/15 0410 11/22/15 0514  HGB 14.6  --   --   --   --   --  13.3 13.7  HCT 42.8  --   --   --   --   --  38.7 39.5  PLT 260  --   --   --   --   --  247 271  APTT  --  31  --   --   --   --   --   --   LABPROT  --  13.2  --   --   --   --   --   --   INR  --  0.98  --   --   --   --   --   --   HEPARINUNFRC  --   --   --   < > 0.64 0.59 0.55 0.56  CREATININE 0.57  --   --   --   --   --  0.50  --   TROPONINI <0.03  --  <0.03  --  <0.03  --   --   --   < > = values in this interval not displayed.  Estimated Creatinine Clearance: 95.9 mL/min (by C-G formula based on Cr of 0.5).  Medications:  Scheduled:  . apixaban  10 mg Oral BID   Followed by  . [START ON 11/29/2015] apixaban  5 mg Oral BID    Assessment: Kristina Eaton is a 40yo female presenting with chest pain and found portal vein thrombosis. Pharmacy initially consulted to manage heparin gtt and now transitioning patient to apixaban.   Goal of Therapy:  Monitor platelets by anticoagulation protocol: Yes   Plan:  Due to short half-life of heparin, will stop heparin gtt and start apixaban at the same time. Will give patient apixaban 10mg  PO BID x7 days followed by apixaban 5mg  PO BID indefinitely.   Pharmacy will continue to monitor.  Cy BlamerAllison K Luke Rigsbee 11/22/2015,10:34 AM

## 2015-11-22 NOTE — Care Management Note (Signed)
Case Management Note  Patient Details  Name: Kristina Eaton MRN: [id number] Date of Birth: 1975-12-21  Subjective/Objective:      Assisted by Spanish language interpreter, Kristina Eaton was provided with an Eliquis coupon, and instructions to take the Coupon to [place] [place] where her doctor has already called in a prescription for Eliquis.  The Interpreter reviewed highlights in Spanish from the Eliquis Patient Information Guide. Kristina Eaton was provided with a Spanish language printout of an Eliquis Patient Information Guide.   Action/Plan:   Expected Discharge Date:                  Expected Discharge Plan:     In-House Referral:     Discharge planning Services  CM Consult, Medication Assistance, MATCH Program  Post Acute Care Choice:    Choice offered to:     DME Arranged:    DME Agency:     HH Arranged:    HH Agency:     Status of Service:  In process, will continue to follow  Medicare Important Message Given:    Date Medicare IM Given:    Medicare IM give by:    Date Additional Medicare IM Given:    Additional Medicare Important Message give by:     If discussed at Long Length of Stay Meetings, dates discussed:    Additional Comments:  Coltyn Hanning A, RN 11/22/2015, 1:54 PM

## 2015-11-22 NOTE — Progress Notes (Signed)
ANTICOAGULATION CONSULT NOTE - Follow Up Consult  Pharmacy Consult for heparin Indication: portal vein thrombosis  No Known Allergies  Patient Measurements: Height: 5\' 1"  (154.9 cm) Weight: 200 lb (90.719 kg) IBW/kg (Calculated) : 47.8 Heparin Dosing Weight: 69 kg  Vital Signs: Temp: 98.7 F (37.1 C) (04/29 0348) Temp Source: Oral (04/29 0348) BP: 112/61 mmHg (04/29 0348) Pulse Rate: 97 (04/29 0348)  Labs:  Recent Labs  11/20/15 0155 11/20/15 [id number] 11/20/15 0722  11/20/15 1302 11/20/15 1936 11/21/15 0410 11/22/15 0514  HGB 14.6  --   --   --   --   --  13.3 13.7  HCT 42.8  --   --   --   --   --  38.7 39.5  PLT 260  --   --   --   --   --  247 271  APTT  --  31  --   --   --   --   --   --   LABPROT  --  13.2  --   --   --   --   --   --   INR  --  0.98  --   --   --   --   --   --   HEPARINUNFRC  --   --   --   < > 0.64 0.59 0.55 0.56  CREATININE 0.57  --   --   --   --   --  0.50  --   TROPONINI <0.03  --  <0.03  --  <0.03  --   --   --   < > = values in this interval not displayed.  Estimated Creatinine Clearance: 95.9 mL/min (by C-G formula based on Cr of 0.5).   Medical History: History reviewed. No pertinent past medical history.  Medications:  Infusions:  . sodium chloride 75 mL/hr at 11/22/15 0004  . heparin 1,200 Units/hr (11/21/15 2052)    Assessment: 39 yof with chest pain since 10 AM and noted left portal vein thrombosis on CT. Pharmacy consulted to dose and monitor heparin drip.  Patient currently ordered heparin drip at rate of 1200 units/hr  HL on 4/27 at 1300: 0.64  Goal of Therapy:  Heparin level 0.3-0.7 units/ml Monitor platelets by anticoagulation protocol: Yes   Plan:  Heparin level therapeutic. Continue current rate. Pharmacy will continue to monitor daily.   Carola FrostNathan A Jleigh Striplin, Pharm.D., BCPS Clinical Pharmacist 11/22/2015,6:04 AM

## 2015-11-24 LAB — ANTINUCLEAR ANTIBODIES, IFA: ANA Ab, IFA: NEGATIVE

## 2015-12-01 ENCOUNTER — Encounter: Payer: Self-pay | Admitting: Hematology and Oncology

## 2015-12-01 ENCOUNTER — Inpatient Hospital Stay: Payer: Self-pay | Attending: Hematology and Oncology | Admitting: Hematology and Oncology

## 2015-12-01 VITALS — BP 136/84 | HR 81 | Temp 95.9°F | Resp 18 | Ht 61.0 in | Wt 223.3 lb

## 2015-12-01 DIAGNOSIS — Z79899 Other long term (current) drug therapy: Secondary | ICD-10-CM | POA: Insufficient documentation

## 2015-12-01 DIAGNOSIS — D6852 Prothrombin gene mutation: Secondary | ICD-10-CM | POA: Insufficient documentation

## 2015-12-01 DIAGNOSIS — R42 Dizziness and giddiness: Secondary | ICD-10-CM | POA: Insufficient documentation

## 2015-12-01 DIAGNOSIS — D6851 Activated protein C resistance: Secondary | ICD-10-CM | POA: Insufficient documentation

## 2015-12-01 DIAGNOSIS — I81 Portal vein thrombosis: Secondary | ICD-10-CM | POA: Insufficient documentation

## 2015-12-01 DIAGNOSIS — R1011 Right upper quadrant pain: Secondary | ICD-10-CM | POA: Insufficient documentation

## 2015-12-01 DIAGNOSIS — Z7901 Long term (current) use of anticoagulants: Secondary | ICD-10-CM | POA: Insufficient documentation

## 2015-12-01 LAB — FACTOR 5 LEIDEN

## 2015-12-01 LAB — CARDIOLIPIN ANTIBODIES, IGG, IGM, IGA
Anticardiolipin IgA: <9 APL U/mL (ref 0–11)
Anticardiolipin IgG: <9 GPL U/mL (ref 0–14)
Anticardiolipin IgM: <9 MPL U/mL (ref 0–12)

## 2015-12-01 LAB — LUPUS ANTICOAGULANT PANEL
DRVVT: 29.9 s (ref 0.0–44.0)
PTT Lupus Anticoagulant: 41.4 s (ref 0.0–43.6)

## 2015-12-01 LAB — PNH PROFILE (-HIGH SENSITIVITY): Viability:: 98

## 2015-12-01 LAB — PROTHROMBIN GENE MUTATION

## 2015-12-01 LAB — BETA-2-GLYCOPROTEIN I ABS, IGG/M/A
Beta-2 Glyco I IgG: <9 GPI IgG units (ref 0–20)
Beta-2-Glycoprotein I IgA: <9 GPI IgA units (ref 0–25)
Beta-2-Glycoprotein I IgM: <9 GPI IgM units (ref 0–32)

## 2015-12-01 LAB — PROTEIN C, TOTAL: Protein C, Total: 82 % (ref 60–150)

## 2015-12-01 LAB — PROTEIN S, TOTAL: Protein S Ag, Total: 117 % (ref 60–150)

## 2015-12-01 LAB — JAK2 GENOTYPR

## 2015-12-01 LAB — PROTEIN S ACTIVITY: Protein S Activity: 48 % — ABNORMAL LOW (ref 63–140)

## 2015-12-01 LAB — PROTEIN C ACTIVITY: Protein C Activity: 109 % (ref 73–180)

## 2015-12-01 NOTE — Progress Notes (Signed)
[facility] day:  12/01/2015  Chief Complaint: Kristina Eaton is a 40 y.o. female with portal vein thrombosis who is seen for assessment after recent hospitalization.  HPI:  The patient was admitted to [facility] from 11/20/2015 - 11/22/2015.  Prior to recent events, she had not seen a physician in over 10 years. She was in her usual state of good health until approximately 1 week ago when she developed mild right upper quadrant pain. Pain progressed over the week. She presented to the emergency room with shortness of breath. She denied any pleuritic chest pain, nausea or vomiting. She denied any melena or hematochezia. She denied any fever.  Initial labs included a normal CBC with diff. Comprehensive metabolic panel included an AST of 52 and an ALT of 68. Lipase was 21.  Chest CT angiogram on 11/20/2015 revealed no evidence of pulmonary embolism. Abdominal and pelvic CT scan on 11/20/2015 revealed left portal vein thrombosis beginning at the bifurcation with vessel expansion and differential liver enhancement.  Bilateral lower extremity duplex on 11/20/2015 revealed no evidence of DVT.  She was seen in consultation.  A hypercoagulable work-up was performed.  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  Protein S-functional was low (48%) and possibily due to acute thrombosis.  The following studies were negative:  lupus anticoagulant panel, anti-cardiolipin antibodies, beta2-glycoprotein, flow cytometry for PNH, JAK2 V617F, protein C antigen  (82%), protein C-functional (109%), protein S total (117%).  Prothrombin gene mutation is pending.  She was started on IV heparin and discharged on Eliquis 10 mg BID x 7 days then 5 mg BID.  Symptomatically, she notes dizziness on standing which she relates to pain in her RUQ.  She has a little nausea.  She Notes 1 nose bleed on Eliqus.  She has joint pain (fingers, wrist, elbows,  shoulder).  She has no family history of any blood disorders, thrombosis, early pregnancy loss, or connective tissue disorder (rheumatoid arthritis, lupus, etc).   History reviewed. No pertinent past medical history.  Past Surgical History  Procedure Laterality Date  . None      Family History  Problem Relation Age of Onset  . Diabetes Brother     Social History:  reports that she has never smoked. She does not have any smokeless tobacco history on file. She reports that she does not drink alcohol or use illicit drugs.  She reports that she does not drink alcohol or use illicit drugs. She is from [place]. She has 3 children. She is accompanied by her boyfriend, 64 year old daughter, Kristina Eaton, and the interpreter today.  Allergies: No Known Allergies  Current Medications: Current Outpatient Prescriptions  Medication Sig Dispense Refill  . apixaban (ELIQUIS) 5 MG TABS tablet Take 1 tablet (5 mg total) by mouth 2 (two) times daily. 60 tablet 6  . HYDROcodone-acetaminophen (NORCO/VICODIN) 5-325 MG tablet Take 1-2 tablets by mouth every 4 (four) hours as needed for moderate pain. 30 tablet 0   No current facility-administered medications for this visit.    Review of Systems:  GENERAL: Until recent events, she has felt good. Active. No fevers, sweats or weight loss. PERFORMANCE STATUS (ECOG): 1 HEENT: No visual changes, runny nose, sore throat, mouth sores or tenderness. Lungs: Sortness of breath with exertion.  No cough. No hemoptysis. Cardiac: No chest pain, palpitations, orthopnea, or PND. GI: Appetite 50%.  Pain causes nausea.  No vomiting, diarrhea, constipation, melena or hematochezia. GU: No urgency,  frequency, dysuria, or hematuria. Musculoskeletal: Joints (fingers, wrist, elbows, shoulder) hurt. No back pain. No muscle tenderness. Extremities: No pain or swelling. Skin: No rashes or skin changes. Neuro: Dizzy at times.  No headache, numbness or weakness,  balance or coordination issues. Endocrine: No diabetes, thyroid issues, hot flashes or night sweats. Psych: No mood changes, depression or anxiety. Pain: No focal pain. Review of systems: All other systems reviewed and found to be negative  Physical Exam: Blood pressure 136/84, pulse 81, temperature 95.9 F (35.5 C), temperature source Tympanic, resp. rate 18, height 5' 1"  (1.549 m), weight 223 lb 5.2 oz (101.3 kg), last menstrual period 05/22/2015. GENERAL: Well developed, well nourished, heavyset woman sitting comfortably in the exam room in no acute distress. MENTAL STATUS: Alert and oriented to person, place and time. HEAD: Long brown hair pulled back. Normocephalic, atraumatic, face symmetric, no Cushingoid features. EYES: Brown eyes. Pupils equal round and reactive to light and accomodation. No conjunctivitis or scleral icterus. ENT: Oropharynx clear without lesion. Tongue normal. Mucous membranes moist.  RESPIRATORY: Clear to auscultation without rales, wheezes or rhonchi. CARDIOVASCULAR: Regular rate and rhythm without murmur, rub or gallop. ABDOMEN: Soft, slightly tender in the RUQ without guarding or rebound tenderness. Active bowel sounds and no appreciable hepatosplenomegaly. No masses. SKIN: No rashes, ulcers or lesions. EXTREMITIES:  No edema, no skin discoloration or tenderness. No palpable cords. LYMPH NODES: No palpable cervical, supraclavicular, axillary or inguinal adenopathy  NEUROLOGICAL: Unremarkable. PSYCH: Appropriate.   No visits with results within 3 Day(s) from this visit. Latest known visit with results is:  Admission on 11/20/2015, Discharged on 11/22/2015  Component Date Value Ref Range Status  . Preg Test, Ur 11/20/2015 NEGATIVE  NEGATIVE Final   Comment:        THE SENSITIVITY OF THIS METHODOLOGY IS >24 mIU/mL   . WBC 11/20/2015 8.7  3.6 - 11.0 K/uL Final  . RBC 11/20/2015 4.79  3.80 - 5.20 MIL/uL Final  . Hemoglobin 11/20/2015  14.6  12.0 - 16.0 g/dL Final  . HCT 11/20/2015 42.8  35.0 - 47.0 % Final  . MCV 11/20/2015 89.3  80.0 - 100.0 fL Final  . MCH 11/20/2015 30.5  26.0 - 34.0 pg Final  . MCHC 11/20/2015 34.2  32.0 - 36.0 g/dL Final  . RDW 11/20/2015 13.1  11.5 - 14.5 % Final  . Platelets 11/20/2015 260  150 - 440 K/uL Final   COUNT MAY BE INACCURATE DUE TO FIBRIN CLUMPS.  Marland Kitchen Neutrophils Relative % 11/20/2015 49%   Final  . Neutro Abs 11/20/2015 4.2  1.4 - 6.5 K/uL Final  . Lymphocytes Relative 11/20/2015 38%   Final  . Lymphs Abs 11/20/2015 3.3  1.0 - 3.6 K/uL Final  . Monocytes Relative 11/20/2015 11%   Final  . Monocytes Absolute 11/20/2015 1.0* 0.2 - 0.9 K/uL Final  . Eosinophils Relative 11/20/2015 2%   Final  . Eosinophils Absolute 11/20/2015 0.2  0 - 0.7 K/uL Final  . Basophils Relative 11/20/2015 0%   Final  . Basophils Absolute 11/20/2015 0.0  0 - 0.1 K/uL Final  . Sodium 11/20/2015 139  135 - 145 mmol/L Final  . Potassium 11/20/2015 3.9  3.5 - 5.1 mmol/L Final  . Chloride 11/20/2015 107  101 - 111 mmol/L Final  . CO2 11/20/2015 24  22 - 32 mmol/L Final  . Glucose, Bld 11/20/2015 107* 65 - 99 mg/dL Final  . BUN 11/20/2015 12  6 - 20 mg/dL Final  . Creatinine, Ser 11/20/2015 0.57  0.44 - 1.00 mg/dL Final  . Calcium 11/20/2015 9.4  8.9 - 10.3 mg/dL Final  . Total Protein 11/20/2015 7.3  6.5 - 8.1 g/dL Final  . Albumin 11/20/2015 3.9  3.5 - 5.0 g/dL Final  . AST 11/20/2015 52* 15 - 41 U/L Final  . ALT 11/20/2015 68* 14 - 54 U/L Final  . Alkaline Phosphatase 11/20/2015 77  38 - 126 U/L Final  . Total Bilirubin 11/20/2015 0.5  0.3 - 1.2 mg/dL Final  . GFR calc non Af Amer 11/20/2015 >60  >60 mL/min Final  . GFR calc Af Amer 11/20/2015 >60  >60 mL/min Final   Comment: (NOTE) The eGFR has been calculated using the CKD EPI equation. This calculation has not been validated in all clinical situations. eGFR's persistently <60 mL/min signify possible Chronic Kidney Disease.   . Anion gap 11/20/2015 8   5 - 15 Final  . Lipase 11/20/2015 21  11 - 51 U/L Final  . Troponin I 11/20/2015 <0.03  <0.031 ng/mL Final   Comment:        NO INDICATION OF MYOCARDIAL INJURY.   . Color, Urine 11/20/2015 COLORLESS* YELLOW Final  . APPearance 11/20/2015 CLEAR* CLEAR Final  . Glucose, UA 11/20/2015 NEGATIVE  NEGATIVE mg/dL Final  . Bilirubin Urine 11/20/2015 NEGATIVE  NEGATIVE Final  . Ketones, ur 11/20/2015 NEGATIVE  NEGATIVE mg/dL Final  . Specific Gravity, Urine 11/20/2015 1.005  1.005 - 1.030 Final  . Hgb urine dipstick 11/20/2015 NEGATIVE  NEGATIVE Final  . pH 11/20/2015 8.0  5.0 - 8.0 Final  . Protein, ur 11/20/2015 NEGATIVE  NEGATIVE mg/dL Final  . Nitrite 11/20/2015 NEGATIVE  NEGATIVE Final  . Leukocytes, UA 11/20/2015 TRACE* NEGATIVE Final  . RBC / HPF 11/20/2015 NONE SEEN  0 - 5 RBC/hpf Final  . WBC, UA 11/20/2015 0-5  0 - 5 WBC/hpf Final  . Bacteria, UA 11/20/2015 NONE SEEN  NONE SEEN Final  . Squamous Epithelial / LPF 11/20/2015 0-5* NONE SEEN Final  . aPTT 11/20/2015 31  24 - 36 seconds Final  . Prothrombin Time 11/20/2015 13.2  11.4 - 15.0 seconds Final  . INR 11/20/2015 0.98   Final  . Heparin Unfractionated 11/20/2015 0.64  0.30 - 0.70 IU/mL Final   Comment:        IF HEPARIN RESULTS ARE BELOW EXPECTED VALUES, AND PATIENT DOSAGE HAS BEEN CONFIRMED, SUGGEST FOLLOW UP TESTING OF ANTITHROMBIN III LEVELS.   . Troponin I 11/20/2015 <0.03  <0.031 ng/mL Final   Comment:        NO INDICATION OF MYOCARDIAL INJURY.   . Troponin I 11/20/2015 <0.03  <0.031 ng/mL Final   Comment:        NO INDICATION OF MYOCARDIAL INJURY.   . Heparin Unfractionated 11/20/2015 0.59  0.30 - 0.70 IU/mL Final   Comment:        IF HEPARIN RESULTS ARE BELOW EXPECTED VALUES, AND PATIENT DOSAGE HAS BEEN CONFIRMED, SUGGEST FOLLOW UP TESTING OF ANTITHROMBIN III LEVELS.   Marland Kitchen Sodium 11/21/2015 137  135 - 145 mmol/L Final  . Potassium 11/21/2015 3.7  3.5 - 5.1 mmol/L Final  . Chloride 11/21/2015 107  101 -  111 mmol/L Final  . CO2 11/21/2015 23  22 - 32 mmol/L Final  . Glucose, Bld 11/21/2015 98  65 - 99 mg/dL Final  . BUN 11/21/2015 11  6 - 20 mg/dL Final  . Creatinine, Ser 11/21/2015 0.50  0.44 - 1.00 mg/dL Final  . Calcium 11/21/2015 8.3* 8.9 - 10.3  mg/dL Final  . GFR calc non Af Amer 11/21/2015 >60  >60 mL/min Final  . GFR calc Af Amer 11/21/2015 >60  >60 mL/min Final   Comment: (NOTE) The eGFR has been calculated using the CKD EPI equation. This calculation has not been validated in all clinical situations. eGFR's persistently <60 mL/min signify possible Chronic Kidney Disease.   . Anion gap 11/21/2015 7  5 - 15 Final  . WBC 11/21/2015 7.0  3.6 - 11.0 K/uL Final  . RBC 11/21/2015 4.36  3.80 - 5.20 MIL/uL Final  . Hemoglobin 11/21/2015 13.3  12.0 - 16.0 g/dL Final  . HCT 11/21/2015 38.7  35.0 - 47.0 % Final  . MCV 11/21/2015 88.8  80.0 - 100.0 fL Final  . MCH 11/21/2015 30.6  26.0 - 34.0 pg Final  . MCHC 11/21/2015 34.4  32.0 - 36.0 g/dL Final  . RDW 11/21/2015 13.2  11.5 - 14.5 % Final  . Platelets 11/21/2015 247  150 - 440 K/uL Final  . Total Protein 11/21/2015 6.3* 6.5 - 8.1 g/dL Final  . Albumin 11/21/2015 3.5  3.5 - 5.0 g/dL Final  . AST 11/21/2015 32  15 - 41 U/L Final  . ALT 11/21/2015 54  14 - 54 U/L Final  . Alkaline Phosphatase 11/21/2015 72  38 - 126 U/L Final  . Total Bilirubin 11/21/2015 0.7  0.3 - 1.2 mg/dL Final  . Bilirubin, Direct 11/21/2015 <0.1* 0.1 - 0.5 mg/dL Final  . Indirect Bilirubin 11/21/2015 NOT CALCULATED  0.3 - 0.9 mg/dL Final  . AntiThromb III Func 11/21/2015 84  75 - 120 % Final   Performed at [facility]  . Heparin Unfractionated 11/21/2015 0.55  0.30 - 0.70 IU/mL Final   Comment:        IF HEPARIN RESULTS ARE BELOW EXPECTED VALUES, AND PATIENT DOSAGE HAS BEEN CONFIRMED, SUGGEST FOLLOW UP TESTING OF ANTITHROMBIN III LEVELS.   Marland Kitchen Heparin Unfractionated 11/22/2015 0.56  0.30 - 0.70 IU/mL Final   Comment:        IF HEPARIN RESULTS ARE  BELOW EXPECTED VALUES, AND PATIENT DOSAGE HAS BEEN CONFIRMED, SUGGEST FOLLOW UP TESTING OF ANTITHROMBIN III LEVELS.   . WBC 11/22/2015 6.8  3.6 - 11.0 K/uL Final  . RBC 11/22/2015 4.42  3.80 - 5.20 MIL/uL Final  . Hemoglobin 11/22/2015 13.7  12.0 - 16.0 g/dL Final  . HCT 11/22/2015 39.5  35.0 - 47.0 % Final  . MCV 11/22/2015 89.4  80.0 - 100.0 fL Final  . MCH 11/22/2015 30.9  26.0 - 34.0 pg Final  . MCHC 11/22/2015 34.6  32.0 - 36.0 g/dL Final  . RDW 11/22/2015 13.0  11.5 - 14.5 % Final  . Platelets 11/22/2015 271  150 - 440 K/uL Final  . ANA Ab, IFA 11/22/2015 Negative   Final   Comment: (NOTE)                                     Negative   <1:80                                     Borderline  1:80  Positive   >1:80 Performed At: [facility] [place], [place] [id number] Lindon Romp MD CX:[id number]     Assessment:  Kristina Eaton is a 40 y.o. female with left portal vein thrombosis. She is heterozygote for Factor V Leiden.  History is negative for smoking, birth control pills, abdominal surgeries, trauma, inflammatory bowel disease, pancreatitis, and pregnancy.  She has no family history of any blood disorders or thrombosis  Chest CT angiogram on 11/20/2015 revealed no evidence of pulmonary embolism. Abdominal and pelvic CT scan on 11/20/2015 revealed left portal vein thrombosis beginning at the bifurcation with vessel expansion and differential liver enhancement.  Hypercoagulable work-up revealed heterozygosity for Factor V Leiden (single R506Q mutation).  Protein S-functional was low (48%) and possibily due to acute thrombosis.  The following studies were negative:  lupus anticoagulant panel, anti-cardiolipin antibodies, beta2-glycoprotein, flow cytometry for PNH, JAK2 V617F, protein C antigen  (82%), protein C-functional (109%), protein S total (117%).  Prothrombin gene mutation is pending.  Symptomatically, she is  dizzy at times secondary to RUQ pain.  She denies any bruising or bleeding.  Exam is stable.  She is on Eliquis.  Plan: 1.  Review hypercoagulable work-up.  Discuss Factor V Leiden heterozygosity.  Discuss common mutation (3-8% general population in [place] are heterozygote).  Risk of thrombosis 4-8 times general population.  Discuss rocks in boat analogy regarding potential other risk factors leading to thrombosis.  Discuss inheritance and transmission to children (50%).  Discuss increased risk of thrombosis with birth control pills (avoid).  Discuss need for anticoagulation for at least 6 months, possibly life long.  Await additional testing. 2.  Discuss continuation of Eliquis 5 mg BID.  Boyfriend notes limites supply of medication.  They will be meeting with [facility] tomorrow. 3.  Follow-up at [facility] tomorrow. 4.  Anticipate repeat protein S functional testing in future. 5.  RTC in 1 month for MD assessment and labs (CBC with diff, CMP).  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   Kristina Asal, MD  12/01/2015, 9:05 AM

## 2015-12-01 NOTE — Progress Notes (Signed)
Pt reports having dizzyness when the pain comes.  It is a feeling that comes from the site of clot up.  Pt placed in wheelchair to assist with dizzyness.  Pt also reports that she gets dizzy of stands up to fast. Will place fall bracelet.  Pt reports having nosebleed last Thursday and had already started taking Eliquis at that time.  Pt reports joint pain in fingers, wrist, elbow and shoulder.

## 2015-12-02 ENCOUNTER — Ambulatory Visit: Payer: Self-pay | Admitting: Family Medicine

## 2015-12-02 VITALS — BP 94/70 | HR 72 | Resp 18 | Wt 224.0 lb

## 2015-12-02 DIAGNOSIS — I81 Portal vein thrombosis: Secondary | ICD-10-CM

## 2015-12-02 MED ORDER — APIXABAN 5 MG PO TABS: 5.0000 mg | ORAL_TABLET | Freq: Two times a day (BID) | ORAL | Status: DC

## 2015-12-02 NOTE — Progress Notes (Signed)
Name: Kristina Eaton   MRN: [id number]    DOB: 1976-05-06   Date:12/02/2015       Progress Note  Subjective  Chief Complaint  Chief Complaint  Patient presents with  . 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 Was anticoagulated and discharged on Eliquis.  Has 30 days supply. Saw Dr. Eual Fines yesterday. Has hypercoagulation labs pending and follow up with Dr. Mike Gip 1 months. Still has occasional RUQ pain, but getting less frequent and not nearly as intense.    No problem-specific assessment & plan notes found for this encounter.   History reviewed. No pertinent past medical history.  Social History  Substance Use Topics  . Smoking status: Never Smoker   . Smokeless tobacco: Not on file  . Alcohol Use: No     Current outpatient prescriptions:  .  apixaban (ELIQUIS) 5 MG TABS tablet, Take 1 tablet (5 mg total) by mouth 2 (two) times daily., Disp: 180 tablet, Rfl: 1 .  HYDROcodone-acetaminophen (NORCO/VICODIN) 5-325 MG tablet, Take 1-2 tablets by mouth every 4 (four) hours as needed for moderate pain., Disp: 30 tablet, Rfl: 0  No Known Allergies  ROS  See HPI  Objective  Filed Vitals:   12/02/15 1815  BP: 94/70  Pulse: 72  Resp: 18  Weight: 224 lb (101.606 kg)     Physical Exam  Awake alert oriented x 3 in NAD. Lungs clear.   Recent Results (from the past 2160 hour(s))  CBC with Differential/Platelet     Status: Abnormal   Collection Time: 11/20/15  1:55 AM  Result Value Ref Range   WBC 8.7 3.6 - 11.0 K/uL   RBC 4.79 3.80 - 5.20 MIL/uL   Hemoglobin 14.6 12.0 - 16.0 g/dL   HCT 42.8 35.0 - 47.0 %   MCV 89.3 80.0 - 100.0 fL   MCH 30.5 26.0 - 34.0 pg   MCHC 34.2 32.0 - 36.0 g/dL   RDW 13.1 11.5 - 14.5 %   Platelets 260 150 - 440 K/uL    Comment: COUNT MAY BE INACCURATE DUE TO FIBRIN CLUMPS.   Neutrophils Relative % 49% %   Neutro Abs 4.2 1.4 - 6.5 K/uL   Lymphocytes Relative 38% %    Lymphs Abs 3.3 1.0 - 3.6 K/uL   Monocytes Relative 11% %   Monocytes Absolute 1.0 (H) 0.2 - 0.9 K/uL   Eosinophils Relative 2% %   Eosinophils Absolute 0.2 0 - 0.7 K/uL   Basophils Relative 0% %   Basophils Absolute 0.0 0 - 0.1 K/uL  Comprehensive metabolic panel     Status: Abnormal   Collection Time: 11/20/15  1:55 AM  Result Value Ref Range   Sodium 139 135 - 145 mmol/L   Potassium 3.9 3.5 - 5.1 mmol/L   Chloride 107 101 - 111 mmol/L   CO2 24 22 - 32 mmol/L   Glucose, Bld 107 (H) 65 - 99 mg/dL   BUN 12 6 - 20 mg/dL   Creatinine, Ser 0.57 0.44 - 1.00 mg/dL   Calcium 9.4 8.9 - 10.3 mg/dL   Total Protein 7.3 6.5 - 8.1 g/dL   Albumin 3.9 3.5 - 5.0 g/dL   AST 52 (H) 15 - 41 U/L   ALT 68 (H) 14 - 54 U/L   Alkaline Phosphatase 77 38 - 126 U/L   Total Bilirubin 0.5 0.3 - 1.2 mg/dL   GFR calc non Af Amer >60 >  60 mL/min   GFR calc Af Amer >60 >60 mL/min    Comment: (NOTE) The eGFR has been calculated using the CKD EPI equation. This calculation has not been validated in all clinical situations. eGFR's persistently <60 mL/min signify possible Chronic Kidney Disease.    Anion gap 8 5 - 15  Lipase, blood     Status: None   Collection Time: 11/20/15  1:55 AM  Result Value Ref Range   Lipase 21 11 - 51 U/L  Troponin I     Status: None   Collection Time: 11/20/15  1:55 AM  Result Value Ref Range   Troponin I <0.03 <0.031 ng/mL    Comment:        NO INDICATION OF MYOCARDIAL INJURY.   Urinalysis complete, with microscopic ([facility] only)     Status: Abnormal   Collection Time: 11/20/15  2:41 AM  Result Value Ref Range   Color, Urine COLORLESS (A) YELLOW   APPearance CLEAR (A) CLEAR   Glucose, UA NEGATIVE NEGATIVE mg/dL   Bilirubin Urine NEGATIVE NEGATIVE   Ketones, ur NEGATIVE NEGATIVE mg/dL   Specific Gravity, Urine 1.005 1.005 - 1.030   Hgb urine dipstick NEGATIVE NEGATIVE   pH 8.0 5.0 - 8.0   Protein, ur NEGATIVE NEGATIVE mg/dL   Nitrite NEGATIVE NEGATIVE   Leukocytes, UA  TRACE (A) NEGATIVE   RBC / HPF NONE SEEN 0 - 5 RBC/hpf   WBC, UA 0-5 0 - 5 WBC/hpf   Bacteria, UA NONE SEEN NONE SEEN   Squamous Epithelial / LPF 0-5 (A) NONE SEEN  Pregnancy, urine POC     Status: None   Collection Time: 11/20/15  2:47 AM  Result Value Ref Range   Preg Test, Ur NEGATIVE NEGATIVE    Comment:        THE SENSITIVITY OF THIS METHODOLOGY IS >24 mIU/mL   APTT     Status: None   Collection Time: 11/20/15  6:07 AM  Result Value Ref Range   aPTT 31 24 - 36 seconds  Protime-INR     Status: None   Collection Time: 11/20/15  6:07 AM  Result Value Ref Range   Prothrombin Time 13.2 11.4 - 15.0 seconds   INR 0.98   Troponin I (q 6hr x 3)     Status: None   Collection Time: 11/20/15  7:22 AM  Result Value Ref Range   Troponin I <0.03 <0.031 ng/mL    Comment:        NO INDICATION OF MYOCARDIAL INJURY.   Heparin level (unfractionated)     Status: None   Collection Time: 11/20/15  1:02 PM  Result Value Ref Range   Heparin Unfractionated 0.64 0.30 - 0.70 IU/mL    Comment:        IF HEPARIN RESULTS ARE BELOW EXPECTED VALUES, AND PATIENT DOSAGE HAS BEEN CONFIRMED, SUGGEST FOLLOW UP TESTING OF ANTITHROMBIN III LEVELS.   Troponin I (q 6hr x 3)     Status: None   Collection Time: 11/20/15  1:02 PM  Result Value Ref Range   Troponin I <0.03 <0.031 ng/mL    Comment:        NO INDICATION OF MYOCARDIAL INJURY.   Heparin level (unfractionated)     Status: None   Collection Time: 11/20/15  7:36 PM  Result Value Ref Range   Heparin Unfractionated 0.59 0.30 - 0.70 IU/mL    Comment:        IF HEPARIN RESULTS ARE BELOW EXPECTED VALUES, AND  PATIENT DOSAGE HAS BEEN CONFIRMED, SUGGEST FOLLOW UP TESTING OF ANTITHROMBIN III LEVELS.   Basic metabolic panel     Status: Abnormal   Collection Time: 11/21/15  4:10 AM  Result Value Ref Range   Sodium 137 135 - 145 mmol/L   Potassium 3.7 3.5 - 5.1 mmol/L   Chloride 107 101 - 111 mmol/L   CO2 23 22 - 32 mmol/L   Glucose, Bld 98  65 - 99 mg/dL   BUN 11 6 - 20 mg/dL   Creatinine, Ser 0.50 0.44 - 1.00 mg/dL   Calcium 8.3 (L) 8.9 - 10.3 mg/dL   GFR calc non Af Amer >60 >60 mL/min   GFR calc Af Amer >60 >60 mL/min    Comment: (NOTE) The eGFR has been calculated using the CKD EPI equation. This calculation has not been validated in all clinical situations. eGFR's persistently <60 mL/min signify possible Chronic Kidney Disease.    Anion gap 7 5 - 15  CBC     Status: None   Collection Time: 11/21/15  4:10 AM  Result Value Ref Range   WBC 7.0 3.6 - 11.0 K/uL   RBC 4.36 3.80 - 5.20 MIL/uL   Hemoglobin 13.3 12.0 - 16.0 g/dL   HCT 38.7 35.0 - 47.0 %   MCV 88.8 80.0 - 100.0 fL   MCH 30.6 26.0 - 34.0 pg   MCHC 34.4 32.0 - 36.0 g/dL   RDW 13.2 11.5 - 14.5 %   Platelets 247 150 - 440 K/uL  Hepatic function panel     Status: Abnormal   Collection Time: 11/21/15  4:10 AM  Result Value Ref Range   Total Protein 6.3 (L) 6.5 - 8.1 g/dL   Albumin 3.5 3.5 - 5.0 g/dL   AST 32 15 - 41 U/L   ALT 54 14 - 54 U/L   Alkaline Phosphatase 72 38 - 126 U/L   Total Bilirubin 0.7 0.3 - 1.2 mg/dL   Bilirubin, Direct <0.1 (L) 0.1 - 0.5 mg/dL   Indirect Bilirubin NOT CALCULATED 0.3 - 0.9 mg/dL  Factor 5 leiden     Status: Abnormal   Collection Time: 11/21/15  4:10 AM  Result Value Ref Range   Recommendations-F5LEID: Comment (A)     Comment: (NOTE) RESULT: 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 Factor V Leiden is more resistant to inactivation by activated protein C.  As a result, factor V persists in the circulation leading to a mild hypercoagulable state.  Factor V Leiden has been reported in patients with deep vein thrombosis, pulmonary embolus, central retinal vein occulsion, cerebral sinus thrombosis, and hepatic vein thrombosis. The relative risk of venous thrombosis is  increased approximately 4-8 fold in individuals who are heterozygous.  About 3- 8% of the general [place] and European population are heterozygous.  The risk of venous thrombosis increases exponentially in patients with more than one risk factor, including:  age, surgery, oral contraceptive use, pregnancy, elevated homocysteine levels, or a Factor II/prothrombin mutation (G20210A).  Additionally, for i ndividuals found to be heterozygous for the Factor V Leiden mutation, presence of a second mutation, Factor V R2, further increases the risk if venous thrombosis. Contact [facility]'s Genetics Customer Service at [phone] for further information on both the Factor II (Prothrombin) DNA Analysis, and Factor V R2 DNA Analysis tests.    Comment Comment     Comment: (  NOTE) **Genetic counselors are available for health care providers to**  discuss results at [phone]). Methodology: DNA analysis of the Factor V gene was performed by allele-specific PCR. The diagnostic sensitivity and specificity is >99% for both. Molecular-based testing is highly accurate, but as in any laboratory test, diagnostic errors may occur. All test results must be combined with clinical information for the most accurate interpretation. This test was developed and its performance characteristics determined by [facility]. It has not been cleared or approved by the Food and Drug Administration. References: Voelkerding K (1996).  Clin Lab Med [phone]. Allison Quarry, PhD, [facility] Ruben Reason, PhD, [facility] Jens Som, PhD, [facility] Annetta Maw, M.S., PhD, [facility] Alfredo Bach, PhD, [facility] Norva Riffle, PhD, [facility] Earlean Polka PhD, [facility] Performed At: [facility] [place], [place] [id number] Nelda Marseille MP:[id number]   Prothrombin gene mutation     Status: Abnormal   Collection Time: 11/21/15  4:10 AM  Result Value Ref Range   Recommendations-PTGENE:  Comment (A)     Comment: (NOTE) SINGLE G-20210-A MUTATION IDENTIFIED (HETEROZYGOTE) Comment: A point mutation (G20210A) in the factor II (prothrombin) gene is the second most common cause of inherited thrombophilia. The incidence of this mutation in the [place] Caucasian population is about 2% and in the [place] American population it is approximately 0.5%. This mutation is rare in the [place] and Native American population. Being heterozygous for a prothrombin mutation increases the risk for developing venous thrombosis about 2 to 3 times above the general population risk. Being homozygous for the prothrombin gene mutation increases the relative risk for venous thrombosis further, although it is not yet known how much further the risk is increased. In women heterozygous for the prothrombin gene mutation, the use of estrogen containing oral contraceptives increases the relative risk of venous thrombosis about 16 times and the risk of developing cerebral thrombosis is also significantly increased. In  pregnancy the prothrombin gene mutation increases risk for venous thrombosis and may increase risk for stillbirth, placental abruption, pre-eclampsia and fetal growth restriction. If the patient possesses two or more congenital or acquired thrombophilic risk factors, the risk for thrombosis may rise to more than the sum of the risk ratios for the individual mutations. This assay detects only the prothrombin G20210A mutation and does not measure genetic abnormalities elsewhere in the genome. Other thrombotic risk factors may be pursued through systematic clinical laboratory analysis. These factors include the R506Q (Leiden) mutation in the Factor V gene, plasma homocysteine levels, as well as testing for deficiencies of antithrombin III, protein C and protein S.    Additional Information Comment     Comment: (NOTE) Genetic Counselors are available for health care providers to discuss results  at [phone]). Methodology: DNA analysis of the Factor II gene was performed by PCR amplification followed by restriction analysis. The diagnostic sensitivity is >99% for both. All the tests must be combined with clinical information for the most accurate interpretation. Molecular-based testing is highly accurate, but as in any laboratory test, diagnostic errors may occur. This test was developed and its performance characteristics determined by [facility]. It has not been cleared or approved by the Food and Drug Administration. Poort SR, et al. Blood. 1996; 76:1950-9326. Varga EA. Circulation. 2004; 712:W58-K99. Mervin Hack, et [place]; 19:700-703. Allison Quarry, PhD, [facility] Ruben Reason, PhD, [facility] Jens Som, PhD, [facility] Annetta Maw, M.S., PhD, [facility] Alfredo Bach, PhD, [facility] [place],  PhD, [facility] Earlean Polka,  PhD, [facility] Performed At: [facility] [place], [place] [id number] Nechama Guard MD JT:[id number]   Lupus anticoagulant panel     Status: None   Collection Time: 11/21/15  4:10 AM  Result Value Ref Range   PTT Lupus Anticoagulant 41.4 0.0 - 43.6 sec    Comment: (NOTE) Additional testing confirms the presence of heparin in the test sample. Results obtained after heparin neutralization.    DRVVT 29.9 0.0 - 44.0 sec    Comment:               **Please note reference interval change**   Lupus Anticoag Interp Comment:     Comment: (NOTE) No lupus anticoagulant was detected. Performed At: [facility] [place], [place] [id number] Lindon Romp MD MA:[id number]   Cardiolipin antibodies, IgG, IgM, IgA     Status: None   Collection Time: 11/21/15  4:10 AM  Result Value Ref Range   Anticardiolipin IgG <9 0 - 14 GPL U/mL    Comment: (NOTE)                          Negative:              <15                          Indeterminate:     15 - 20                           Low-Med Positive: >20 - 80                          High Positive:         >80    Anticardiolipin IgM <9 0 - 12 MPL U/mL    Comment: (NOTE)                          Negative:              <13                          Indeterminate:     13 - 20                          Low-Med Positive: >20 - 80                          High Positive:         >80    Anticardiolipin IgA <9 0 - 11 APL U/mL    Comment: (NOTE)                          Negative:              <12                          Indeterminate:     12 - 20                          Low-Med Positive: >20 - 80  High Positive:         >80 Performed At: [facility] [place], [place] [id number] Lindon Romp MD [id number]   Beta-2-glycoprotein i abs, IgG/M/A     Status: None   Collection Time: 11/21/15  4:10 AM  Result Value Ref Range   Beta-2 Glyco I IgG <9 0 - 20 GPI IgG units    Comment: (NOTE) The reference interval reflects a 3SD or 99th percentile interval, which is thought to represent a potentially clinically significant result in accordance with the International Consensus Statement on the classification criteria for definitive antiphospholipid syndrome (APS). J Thromb Haem 2006;4:295-306.    Beta-2-Glycoprotein I IgM <9 0 - 32 GPI IgM units    Comment: (NOTE) The reference interval reflects a 3SD or 99th percentile interval, which is thought to represent a potentially clinically significant result in accordance with the International Consensus Statement on the classification criteria for definitive antiphospholipid syndrome (APS). J Thromb Haem 2006;4:295-306. Performed At: [facility] [place], [place] [id number] Lindon Romp MD [id number]    Beta-2-Glycoprotein I IgA <9 0 - 25 GPI IgA units    Comment: (NOTE) The reference interval reflects a 3SD or 99th percentile interval, which is thought to represent a potentially  clinically significant result in accordance with the International Consensus Statement on the classification criteria for definitive antiphospholipid syndrome (APS). J Thromb Haem 2006;4:295-306.   JAK2 genotypr     Status: None   Collection Time: 11/21/15  4:10 AM  Result Value Ref Range   JAK2 GenotypR Comment     Comment: (NOTE) Result: NEGATIVE for the JAK2 V617F mutation. Interpretation:  The G to T nucleotide change encoding the V617F mutation was not detected.  This result does not rule out the presence of the JAK2 mutation at a level below the sensitivity of detection of this assay, or the presence of other mutations within JAK2 not detected by this assay.  This result does not rule out a diagnosis of polycythemia vera, essential thrombocythemia or idiopathic myelofibrosis as the V617F mutation is not detected in all patients with these disorders.    Director Review, JAK2 Comment     Comment: (NOTE) Jacklynn Bue MS, PhD, [facility] and [place], [place] [phone]    BACKGROUND: Comment     Comment: (NOTE) JAK2 is a cytoplasmic tyrosine kinase with a key role in signal transduction from multiple hematopoietic growth factor receptors. A point mutation within exon 14 of the JAK2 gene (D9833A) encoding a valine to phenylalanine substitution at position 617 of the JAK2 protein (V617F) has been identified in most patients with polycythemia vera, and in about half of those with either essential thrombocythemia or idiopathic myelofibrosis.  The V617F has also been detected, although infrequently, in other myeloid disorders such as chronic myelomonocytic leukemia and chronic neutrophilic leukemia. V617F is an acquired mutation that alters a highly conserved valine present in the negative regulatory JH2 domain of the JAK2 protein and is predicted to dysregulate kinase activity. Methodology: Genomic  DNA was purified from the provided specimen.  Allele- specific PCR using fluorescent primers was used to simultaneously amplify both the wild type and mutant alleles. Amplificati on products were analyzed by capillary electrophoresis.  This assay has a sensitivity to detect approximately a 5% population of cells containing the V617F mutation in a background of non-mutant cells. Reference: Tefferi A and Gilliland DG.  The JAK2 V617F Tyrosine Kinase Mutation  in Myeloproliferative  Disorders:  Status Report and Immediate Implications for Disease Classification and Diagnosis. Mayo Clin Proc 2005;80(7):947-958. Performed At: [facility] [address] [place], [place] [id number] Nechama Guard MD PR:[id number] Performed At: [facility] [address] [place], [place] [id number] Nechama Guard MD TJ:[id number]   Protein C activity     Status: None   Collection Time: 11/21/15  4:10 AM  Result Value Ref Range   Protein C Activity 109 73 - 180 %    Comment: (NOTE) Performed At: [facility] [place], [place] [id number] Lindon Romp MD KT:[id number]   Protein C, total     Status: None   Collection Time: 11/21/15  4:10 AM  Result Value Ref Range   Protein C, Total 82 60 - 150 %    Comment: (NOTE) Performed At: [facility] [place], [place] [id number] Lindon Romp MD [id number]   Protein S activity     Status: Abnormal   Collection Time: 11/21/15  4:10 AM  Result Value Ref Range   Protein S Activity 48 (L) 63 - 140 %    Comment: (NOTE) A deficiency of protein S (PS), either congenital or acquired, increases the risk of thromboembolism. PS activity levels may be falsely low in individuals with APCR/Factor V Leiden. Consider performing free protein S antigen in those with APCR/Factor V Leiden before making a diagnosis of protein S deficiency. Acquired PS deficiency is more common than congenital  deficiency. PS values decrease with normal pregnancy, and are also dependent on age, sex and hormone status. PS values tend to be lower in a younger age group and lower in women than in men. Levels may be decreased in pre-menopausal women on oral contraceptive agents. Acquired deficiency can occur as a result of vitamin K deficiency or antagonism, severe hepatic disorders, (hepatitis, cirrhosis, etc.), nephrotic syndrome, inflammatory bowel disease, certain chemotherapeutic agents, L-asparaginse therapy, sepsis, disseminated intravascular coagulation (DIC) and acute thrombosis. Levels may be decreased in  patients with polycythemia vera, sickle cell disease and essential thrombocythemia. Repeat evaluation on a new plasma sample to confirm or refute this result should be considered, after ruling out acquired causes, depending on the clinical scenario. Performed At: [facility] [place], [place] [id number] Lindon Romp MD OE:[id number]   Protein S, total     Status: None   Collection Time: 11/21/15  4:10 AM  Result Value Ref Range   Protein S Ag, Total 117 60 - 150 %    Comment: (NOTE) This test was developed and its performance characteristics determined by [facility]. It has not been cleared or approved by the Food and Drug Administration. Performed At: [facility] [place], [place] [id number] Lindon Romp MD VQ:[id number]   Antithrombin III     Status: None   Collection Time: 11/21/15  4:10 AM  Result Value Ref Range   AntiThromb III Func 84 75 - 120 %    Comment: Performed at [facility]  [place] Profile (High Sensitivity) - [facility] only     Status: None   Collection Time: 11/21/15  4:10 AM  Result Value Ref Range   Interpretation Comment     Comment: (NOTE) Peripheral Blood: No evidence of paroxysmal nocturnal hemoglobinuria (PNH).    Comment: Comment     Comment: (NOTE) At the sensitivity level of this assay  (typically 0.01-0.1%), these results do not support a diagnosis of paroxysmal nocturnal  hemoglobinuria (PNH). Correlation with all available clinical, laboratory, and morphologic data is recommended. (sensitivity typically 0.01-0.1%; lower limit of detection dependent on number of cells present and events acquired, typically 90,000+ events acquired)    Specimen: Peripheral Blood    Submitted Dx: Comment     Comment: Evaluation for paroxysmal nocturnal hemoglobinuria (PNH)   Viability: 98%     Comment: (7AAD exclusion)   Cell Population Comment     Comment:                          Population Analysis   Granulocytes: anchor deficiency     Comment: Comment No GPI    Monocytes: anchor deficiency     Comment: Comment No GPI    Antibodies Performed: Comment     Comment: CD14, CD15, CD24, CD45, CD64, FLAER   Comment: Comment     Comment: (NOTE) This test was developed and its performance characteristics determined by [facility]. It has not been cleared or approved by the Food and Drug Administration (FDA). The FDA has determined that such clearance or approval is not necessary. Any image(s) that accompany this report is/are a representative image(s) only and should not be used to render a diagnosis.    Flow Cytometry PNH Comment     Comment: (NOTE) Reviewed By: Dimas Millin, M.D. Performed At: ;# [facility] [address] [place], [place] [id number] Ebony Hail MD [id number] Performed At: [facility] [place], [place] [id number] Lindon Romp MD SU:[id number]   Heparin level (unfractionated)     Status: None   Collection Time: 11/21/15  4:10 AM  Result Value Ref Range   Heparin Unfractionated 0.55 0.30 - 0.70 IU/mL    Comment:        IF HEPARIN RESULTS ARE BELOW EXPECTED VALUES, AND PATIENT DOSAGE HAS BEEN CONFIRMED, SUGGEST FOLLOW UP TESTING OF ANTITHROMBIN III LEVELS.   Heparin level (unfractionated)      Status: None   Collection Time: 11/22/15  5:14 AM  Result Value Ref Range   Heparin Unfractionated 0.56 0.30 - 0.70 IU/mL    Comment:        IF HEPARIN RESULTS ARE BELOW EXPECTED VALUES, AND PATIENT DOSAGE HAS BEEN CONFIRMED, SUGGEST FOLLOW UP TESTING OF ANTITHROMBIN III LEVELS.   CBC     Status: None   Collection Time: 11/22/15  5:14 AM  Result Value Ref Range   WBC 6.8 3.6 - 11.0 K/uL   RBC 4.42 3.80 - 5.20 MIL/uL   Hemoglobin 13.7 12.0 - 16.0 g/dL   HCT 39.5 35.0 - 47.0 %   MCV 89.4 80.0 - 100.0 fL   MCH 30.9 26.0 - 34.0 pg   MCHC 34.6 32.0 - 36.0 g/dL   RDW 13.0 11.5 - 14.5 %   Platelets 271 150 - 440 K/uL  Antinuclear Antibodies, IFA     Status: None   Collection Time: 11/22/15  5:14 AM  Result Value Ref Range   ANA Ab, IFA Negative     Comment: (NOTE)                                     Negative   <1:80  Borderline  1:80                                     Positive   >1:80 Performed At: [facility] [facility] [place], [place] [id number] Lindon Romp MD QX:[phone]      Assessment & Plan  1. Portal vein thrombosis Eliquis is on formulary for patient assistance program. Rx for 24m BId #[id number] send to Medication management. Patient states through interpreter she will pay for refill if patient assistance does not go through before her 30 day supply runs out.

## 2015-12-30 ENCOUNTER — Encounter (INDEPENDENT_AMBULATORY_CARE_PROVIDER_SITE_OTHER): Payer: Self-pay

## 2015-12-30 ENCOUNTER — Ambulatory Visit: Payer: Self-pay

## 2016-01-02 ENCOUNTER — Inpatient Hospital Stay: Payer: Self-pay | Attending: Hematology and Oncology

## 2016-01-02 ENCOUNTER — Encounter: Payer: Self-pay | Admitting: *Deleted

## 2016-01-02 ENCOUNTER — Inpatient Hospital Stay (HOSPITAL_BASED_OUTPATIENT_CLINIC_OR_DEPARTMENT_OTHER): Payer: Self-pay | Admitting: Hematology and Oncology

## 2016-01-02 ENCOUNTER — Telehealth: Payer: Self-pay | Admitting: *Deleted

## 2016-01-02 VITALS — BP 112/73 | HR 82 | Temp 96.2°F | Resp 17 | Ht 61.0 in | Wt 220.6 lb

## 2016-01-02 DIAGNOSIS — Z79899 Other long term (current) drug therapy: Secondary | ICD-10-CM | POA: Insufficient documentation

## 2016-01-02 DIAGNOSIS — R42 Dizziness and giddiness: Secondary | ICD-10-CM | POA: Insufficient documentation

## 2016-01-02 DIAGNOSIS — D6852 Prothrombin gene mutation: Secondary | ICD-10-CM | POA: Insufficient documentation

## 2016-01-02 DIAGNOSIS — I81 Portal vein thrombosis: Secondary | ICD-10-CM | POA: Insufficient documentation

## 2016-01-02 DIAGNOSIS — D6851 Activated protein C resistance: Secondary | ICD-10-CM

## 2016-01-02 LAB — CBC WITH DIFFERENTIAL/PLATELET
Basophils Absolute: 0 10*3/uL (ref 0–0.1)
Basophils Relative: 1 %
Eosinophils Absolute: 0.1 10*3/uL (ref 0–0.7)
Eosinophils Relative: 2 %
HCT: 42.5 % (ref 35.0–47.0)
Hemoglobin: 14.8 g/dL (ref 12.0–16.0)
Lymphocytes Relative: 44 %
Lymphs Abs: 2.4 10*3/uL (ref 1.0–3.6)
MCH: 31 pg (ref 26.0–34.0)
MCHC: 34.9 g/dL (ref 32.0–36.0)
MCV: 88.9 fL (ref 80.0–100.0)
Monocytes Absolute: 0.5 10*3/uL (ref 0.2–0.9)
Monocytes Relative: 9 %
Neutro Abs: 2.3 10*3/uL (ref 1.4–6.5)
Neutrophils Relative %: 44 %
Platelets: 279 10*3/uL (ref 150–440)
RBC: 4.78 MIL/uL (ref 3.80–5.20)
RDW: 13.3 % (ref 11.5–14.5)
WBC: 5.3 10*3/uL (ref 3.6–11.0)

## 2016-01-02 LAB — COMPREHENSIVE METABOLIC PANEL
ALT: 49 U/L (ref 14–54)
AST: 35 U/L (ref 15–41)
Albumin: 4.3 g/dL (ref 3.5–5.0)
Alkaline Phosphatase: 76 U/L (ref 38–126)
Anion gap: 6 (ref 5–15)
BUN: 13 mg/dL (ref 6–20)
CO2: 26 mmol/L (ref 22–32)
Calcium: 9.5 mg/dL (ref 8.9–10.3)
Chloride: 106 mmol/L (ref 101–111)
Creatinine, Ser: 0.52 mg/dL (ref 0.44–1.00)
GFR calc Af Amer: >60 mL/min (ref >60)
GFR calc non Af Amer: >60 mL/min (ref >60)
Glucose, Bld: 107 mg/dL — ABNORMAL HIGH (ref 65–99)
Potassium: 3.8 mmol/L (ref 3.5–5.1)
Sodium: 138 mmol/L (ref 135–145)
Total Bilirubin: 0.9 mg/dL (ref 0.3–1.2)
Total Protein: 7.5 g/dL (ref 6.5–8.1)

## 2016-01-02 MED ORDER — APIXABAN 5 MG PO TABS: 5.0000 mg | ORAL_TABLET | Freq: Two times a day (BID) | ORAL | Status: DC

## 2016-01-02 NOTE — Progress Notes (Signed)
[facility] day:  01/02/2016  Chief Complaint: Kristina Eaton is a 40 y.o. 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  HPI:  The patient was last seen in the hematology clinic on 12/01/2015.  At that time, she was seen for initial assessment after her hospitalization for portal vein thrombosis.  She was on Eliquis.  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  After her appointment, additional testing was positive for prothrombin gene mutation.  Symptomatically, she notes chronic dizziness on standing.  She notes 2 falls.  Two weeks ago, she fell on her knees.  Ten days ago, she fell on her bottom.  She comments that her knees are uncomfortable/tingly.  Her right upper quadrant pain has been better.  She has not required pain medications in 2-3 weeks.  She notes acid reflux and constipation.  She ran out of her Eliquis 1 week ago.    No past medical history on file.  Past Surgical History  Procedure Laterality Date  . None      Family History  Problem Relation Age of Onset  . Diabetes Brother   . Hypertension Sister     Social History:  reports that she has never smoked. She does not have any smokeless tobacco history on file. She reports that she does not drink alcohol or use illicit drugs.  She reports that she does not drink alcohol or use illicit drugs. She is from [place]. She has 3 children. She is accompanied by her boyfriend, 64 year old daughter, Estill Bamberg, and the interpreter today.  Allergies: No Known Allergies  Current Medications: Current Outpatient Prescriptions  Medication Sig Dispense Refill  . HYDROcodone-acetaminophen (NORCO/VICODIN) 5-325 MG tablet Take 1-2 tablets by mouth every 4 (four) hours as needed for moderate pain. 30 tablet 0  . apixaban (ELIQUIS) 5 MG TABS tablet Take 1 tablet (5 mg total) by mouth 2 (two) times daily. (Patient not taking: Reported on  01/02/2016) 180 tablet 1   No current facility-administered medications for this visit.    Review of Systems:  GENERAL: Feels "ok". Active. No fevers or sweats.  Weight down 3 pounds. PERFORMANCE STATUS (ECOG): 1 HEENT: No visual changes, runny nose, sore throat, mouth sores or tenderness. Lungs:  No shortness of breath or cough. No hemoptysis. Cardiac: No chest pain, palpitations, orthopnea, or PND. GI: No abdominal pain for 2-3 weeks.  Acid reflux.  Constipation.  No vomiting, diarrhea, melena or hematochezia. GU: No urgency, frequency, dysuria, or hematuria. Musculoskeletal: Joints (fingers, wrist, elbows, shoulder) hurt. Knees tingly.  No back pain. No muscle tenderness. Extremities: No pain or swelling. Skin: No rashes or skin changes. Neuro: Dizzy at times on standing (chronic) s/p 2 falls.  No headache, numbness or weakness, balance or coordination issues. Endocrine: No diabetes, thyroid issues, hot flashes or night sweats. Psych: No mood changes, depression or anxiety. Pain: No focal pain. Review of systems: All other systems reviewed and found to be negative  Physical Exam: Blood pressure 112/73, pulse 82, temperature 96.2 F (35.7 C), resp. rate 17, height 5' 1"  (1.549 m), weight 220 lb 9.1 oz (100.05 kg). No orthostatic changes. GENERAL: Well developed, well nourished, heavyset woman sitting comfortably in the exam room in no acute distress. MENTAL STATUS: Alert and oriented to person, place and time. HEAD: Long brown hair. Normocephalic, atraumatic, face symmetric, no Cushingoid features. EYES: Brown eyes. Pupils equal round and reactive to light and  accomodation. No conjunctivitis or scleral icterus. ENT: Oropharynx clear without lesion. Tongue normal. Mucous membranes moist.  RESPIRATORY: Clear to auscultation without rales, wheezes or rhonchi. CARDIOVASCULAR: Regular rate and rhythm without murmur, rub or gallop. ABDOMEN: Soft, minimally  tender in the RUQ without guarding or rebound tenderness. Active bowel sounds and no appreciable hepatosplenomegaly. No masses. SKIN: No rashes, ulcers or lesions. EXTREMITIES:  No edema, no skin discoloration or tenderness. No palpable cords. LYMPH NODES: No palpable cervical, supraclavicular, axillary or inguinal adenopathy  NEUROLOGICAL: Unremarkable. PSYCH: Appropriate.   Appointment on 01/02/2016  Component Date Value Ref Range Status  . WBC 01/02/2016 5.3  3.6 - 11.0 K/uL Final  . RBC 01/02/2016 4.78  3.80 - 5.20 MIL/uL Final  . Hemoglobin 01/02/2016 14.8  12.0 - 16.0 g/dL Final  . HCT 01/02/2016 42.5  35.0 - 47.0 % Final  . MCV 01/02/2016 88.9  80.0 - 100.0 fL Final  . MCH 01/02/2016 31.0  26.0 - 34.0 pg Final  . MCHC 01/02/2016 34.9  32.0 - 36.0 g/dL Final  . RDW 01/02/2016 13.3  11.5 - 14.5 % Final  . Platelets 01/02/2016 279  150 - 440 K/uL Final  . Neutrophils Relative % 01/02/2016 44   Final  . Neutro Abs 01/02/2016 2.3  1.4 - 6.5 K/uL Final  . Lymphocytes Relative 01/02/2016 44   Final  . Lymphs Abs 01/02/2016 2.4  1.0 - 3.6 K/uL Final  . Monocytes Relative 01/02/2016 9   Final  . Monocytes Absolute 01/02/2016 0.5  0.2 - 0.9 K/uL Final  . Eosinophils Relative 01/02/2016 2   Final  . Eosinophils Absolute 01/02/2016 0.1  0 - 0.7 K/uL Final  . Basophils Relative 01/02/2016 1   Final  . Basophils Absolute 01/02/2016 0.0  0 - 0.1 K/uL Final  . Sodium 01/02/2016 138  135 - 145 mmol/L Final  . Potassium 01/02/2016 3.8  3.5 - 5.1 mmol/L Final  . Chloride 01/02/2016 106  101 - 111 mmol/L Final  . CO2 01/02/2016 26  22 - 32 mmol/L Final  . Glucose, Bld 01/02/2016 107* 65 - 99 mg/dL Final  . BUN 01/02/2016 13  6 - 20 mg/dL Final  . Creatinine, Ser 01/02/2016 0.52  0.44 - 1.00 mg/dL Final  . Calcium 01/02/2016 9.5  8.9 - 10.3 mg/dL Final  . Total Protein 01/02/2016 7.5  6.5 - 8.1 g/dL Final  . Albumin 01/02/2016 4.3  3.5 - 5.0 g/dL Final  . AST 01/02/2016 35  15 - 41 U/L  Final  . ALT 01/02/2016 49  14 - 54 U/L Final  . Alkaline Phosphatase 01/02/2016 76  38 - 126 U/L Final  . Total Bilirubin 01/02/2016 0.9  0.3 - 1.2 mg/dL Final  . GFR calc non Af Amer 01/02/2016 >60  >60 mL/min Final  . GFR calc Af Amer 01/02/2016 >60  >60 mL/min Final   Comment: (NOTE) The eGFR has been calculated using the CKD EPI equation. This calculation has not been validated in all clinical situations. eGFR's persistently <60 mL/min signify possible Chronic Kidney Disease.   . Anion gap 01/02/2016 6  5 - 15 Final    Assessment:  Semiyah Newgent is a 40 y.o. female with left portal vein thrombosis. She is heterozygote for Factor V Leiden.  History is negative for smoking, birth control pills, abdominal surgeries, trauma, inflammatory bowel disease, pancreatitis, and pregnancy.  She has no family history of any blood disorders or thrombosis  Chest CT angiogram on 11/20/2015 revealed no evidence  of pulmonary embolism. Abdominal and pelvic CT scan on 11/20/2015 revealed left portal vein thrombosis beginning at the bifurcation with vessel expansion and differential liver enhancement.  Hypercoagulable work-up revealed heterozygosity for Factor V Leiden (single [id number] mutation) and prothrombin gene mutation (single [id number] mutation).  Protein S-functional was low (48%) and possibily due to acute thrombosis.  The following studies were negative:  lupus anticoagulant panel, anti-cardiolipin antibodies, beta2-glycoprotein, flow cytometry for PNH, JAK2 V617F, protein C antigen  (82%), protein C-functional (109%), protein S total (117%).  Symptomatically, she has intermittent dizziness on standing.  She is not orthostatic.  She ran out of her Eliquis 1 week ago secondary to financial reasons.  Exam is stable.    Plan: 1.  Labs today:  CBC with diff, CMP. 2.  Review hypercoagulable work-up.  Discuss Factor V Leiden heterozygosity and prothrombin gene mutation.  Discuss lifelong anticoagulation.   Discuss children's risk of inheriting Factor V Leiden and prothrombin gene mutation. 3.  Obtain assistance for Eliquis 5 mg BID.  Calls and paperwork completed. 4.  Samples for Xarelto 20 mg po q day until Eliquis available (2 week supply provided).  Patient instructed to stop Xarelto when Eliquis arrives (not to take both).  Donnie Mesa, RN and interpreter reviewed in detail. 4.  Patient to follow-up with PCP regarding evaluation of dizziness (anticipate cardiology +/- neurology evalution). 5.  Anticipate repeat protein S functional testing in future. 6.  RTC in 1 month for MD assessment and +/- labs (CBC with diff, CMP).   Lequita Asal, MD  01/02/2016, 9:55 AM

## 2016-01-02 NOTE — Telephone Encounter (Signed)
Pt does not have insurance and she has been out of eliquis for 1 week and she has no way of paying for it.  At first she got a copay card for the first month. I spoke to a staff member at [employer] and went over criteria on the phone while entering info on line and pt should qualify for the medication asst. eliquis 5 mg bid.  Papers printed and signed and faxed to [phone]. Pt was also given xarelto 20 mg once daily from samples to take while pt is waiting on approval for eliquis asst

## 2016-01-02 NOTE — Progress Notes (Signed)
Pt reports that she has had two falls since last visit.  Both falls related to being dizzy after taking Eliquis within 30 minutes.  Currently pt is not taking Eliquis related to price of $450.  Pt reports that even though she has been off medication she still has dizziness.  Pt reports that in her knees are a numb feeling in bot.  She overall feels like an in general weakness.  Pt reports having constipation and acid reflux she is taking tums.

## 2016-01-04 ENCOUNTER — Encounter: Payer: Self-pay | Admitting: Hematology and Oncology

## 2016-01-14 ENCOUNTER — Ambulatory Visit: Payer: Self-pay | Attending: Internal Medicine

## 2016-02-02 ENCOUNTER — Other Ambulatory Visit: Payer: Self-pay | Admitting: Hematology and Oncology

## 2016-02-02 ENCOUNTER — Inpatient Hospital Stay: Payer: Self-pay | Attending: Hematology and Oncology

## 2016-02-02 ENCOUNTER — Encounter: Payer: Self-pay | Admitting: Hematology and Oncology

## 2016-02-02 ENCOUNTER — Inpatient Hospital Stay (HOSPITAL_BASED_OUTPATIENT_CLINIC_OR_DEPARTMENT_OTHER): Payer: Self-pay | Admitting: Hematology and Oncology

## 2016-02-02 VITALS — BP 105/71 | HR 65 | Temp 98.0°F | Resp 18 | Wt 215.6 lb

## 2016-02-02 DIAGNOSIS — Z79899 Other long term (current) drug therapy: Secondary | ICD-10-CM

## 2016-02-02 DIAGNOSIS — Z7901 Long term (current) use of anticoagulants: Secondary | ICD-10-CM | POA: Insufficient documentation

## 2016-02-02 DIAGNOSIS — D6852 Prothrombin gene mutation: Secondary | ICD-10-CM | POA: Insufficient documentation

## 2016-02-02 DIAGNOSIS — I81 Portal vein thrombosis: Secondary | ICD-10-CM

## 2016-02-02 DIAGNOSIS — D6851 Activated protein C resistance: Secondary | ICD-10-CM

## 2016-02-02 LAB — CBC WITH DIFFERENTIAL/PLATELET
Basophils Absolute: 0 10*3/uL (ref 0–0.1)
Basophils Relative: 0 %
Eosinophils Absolute: 0.1 10*3/uL (ref 0–0.7)
Eosinophils Relative: 2 %
HCT: 44.3 % (ref 35.0–47.0)
Hemoglobin: 15.4 g/dL (ref 12.0–16.0)
Lymphocytes Relative: 44 %
Lymphs Abs: 2 10*3/uL (ref 1.0–3.6)
MCH: 30.9 pg (ref 26.0–34.0)
MCHC: 34.8 g/dL (ref 32.0–36.0)
MCV: 88.7 fL (ref 80.0–100.0)
Monocytes Absolute: 0.4 10*3/uL (ref 0.2–0.9)
Monocytes Relative: 9 %
Neutro Abs: 2 10*3/uL (ref 1.4–6.5)
Neutrophils Relative %: 45 %
Platelets: 275 10*3/uL (ref 150–440)
RBC: 5 MIL/uL (ref 3.80–5.20)
RDW: 13.3 % (ref 11.5–14.5)
WBC: 4.4 10*3/uL (ref 3.6–11.0)

## 2016-02-02 LAB — COMPREHENSIVE METABOLIC PANEL
ALT: 38 U/L (ref 14–54)
AST: 25 U/L (ref 15–41)
Albumin: 4.2 g/dL (ref 3.5–5.0)
Alkaline Phosphatase: 72 U/L (ref 38–126)
Anion gap: 5 (ref 5–15)
BUN: 12 mg/dL (ref 6–20)
CO2: 24 mmol/L (ref 22–32)
Calcium: 8.7 mg/dL — ABNORMAL LOW (ref 8.9–10.3)
Chloride: 106 mmol/L (ref 101–111)
Creatinine, Ser: 0.43 mg/dL — ABNORMAL LOW (ref 0.44–1.00)
GFR calc Af Amer: >60 mL/min (ref >60)
GFR calc non Af Amer: >60 mL/min (ref >60)
Glucose, Bld: 106 mg/dL — ABNORMAL HIGH (ref 65–99)
Potassium: 3.6 mmol/L (ref 3.5–5.1)
Sodium: 135 mmol/L (ref 135–145)
Total Bilirubin: 1 mg/dL (ref 0.3–1.2)
Total Protein: 7.2 g/dL (ref 6.5–8.1)

## 2016-02-02 NOTE — Progress Notes (Signed)
[facility] day:  02/02/2016  Chief Complaint: Kristina Eaton is a 40 y.o. 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  HPI:  The patient was last seen in the hematology clinic on 01/02/2016.  At that time, she noted intermittent dizziness on standing. She was not orthostatic. She had run out of her Eliquis 1 week prior.  Hypercoagulable workup was reviewed. She was positive for Factor V Leiden as well as a prothrombin gene mutation.  We discussed life long anticoagulation. Samples of Xarelto were provided until Eliquis was available.  During the interim, she has done well. She notes no longer being dizzy.  She denies any excess bruising or bleeding. She had one small episode of left-sided epistaxis. She has been provided with Eliquis through 01/05/2017.  She is trying to lose weight. She feels that Eliquis has lowered her appetite. She is eating fruits and vegetables.   History reviewed. No pertinent past medical history.  Past Surgical History  Procedure Laterality Date  . None      Family History  Problem Relation Age of Onset  . Diabetes Brother   . Hypertension Sister     Social History:  reports that she has never smoked. She does not have any smokeless tobacco history on file. She reports that she does not drink alcohol or use illicit drugs.  She reports that she does not drink alcohol or use illicit drugs. She is from [place]. She has 3 children. She is accompanied by her boyfriend, 54 year old daughter, Kristina Eaton, and the interpreter today.  Allergies: No Known Allergies  Current Medications: Current Outpatient Prescriptions  Medication Sig Dispense Refill  . apixaban (ELIQUIS) 5 MG TABS tablet Take 1 tablet (5 mg total) by mouth 2 (two) times daily. 180 tablet 3  . HYDROcodone-acetaminophen (NORCO/VICODIN) 5-325 MG tablet Take 1-2 tablets by mouth every 4 (four) hours as needed for moderate  pain. (Patient not taking: Reported on 02/02/2016) 30 tablet 0   No current facility-administered medications for this visit.    Review of Systems:  GENERAL: Feels "good". Active. No fevers or sweats.  Weight down 5 pounds. PERFORMANCE STATUS (ECOG): 1 HEENT: No visual changes, runny nose, sore throat, mouth sores or tenderness. Lungs:  No shortness of breath or cough. No hemoptysis. Cardiac: No chest pain, palpitations, orthopnea, or PND. GI: No abdominal pain for 3 weeks.  Acid reflux.  Constipation.  No vomiting, diarrhea, melena or hematochezia. GU: No urgency, frequency, dysuria, or hematuria. Musculoskeletal: Joints (fingers, wrist, elbows, shoulder) hurt. Knees tingly.  No back pain. No muscle tenderness. Extremities: No pain or swelling. Skin: No rashes or skin changes. Neuro: No episodes of dizziness.  No headache, numbness or weakness, balance or coordination issues. Endocrine: No diabetes, thyroid issues, hot flashes or night sweats. Psych: No mood changes, depression or anxiety. Pain: No focal pain. Review of systems: All other systems reviewed and found to be negative  Physical Exam: Blood pressure 105/71, pulse 65, temperature 98 F (36.7 C), temperature source Oral, resp. rate 18, weight 215 lb 9.8 oz (97.8 kg), SpO2 99 %. GENERAL: Well developed, well nourished, heavyset woman sitting comfortably in the exam room in no acute distress. MENTAL STATUS: Alert and oriented to person, place and time. HEAD: Long brown hair. Normocephalic, atraumatic, face symmetric, no Cushingoid features. EYES: Brown eyes. Pupils equal round and reactive to light and accomodation. No conjunctivitis or scleral icterus. ENT: Oropharynx  clear without lesion. Tongue normal. Mucous membranes moist.  RESPIRATORY: Clear to auscultation without rales, wheezes or rhonchi. CARDIOVASCULAR: Regular rate and rhythm without murmur, rub or gallop. ABDOMEN: Soft, non-tender  with active bowel sounds and no appreciable hepatosplenomegaly. No masses. SKIN: No rashes, ulcers or lesions. EXTREMITIES:  No edema, no skin discoloration or tenderness. No palpable cords. LYMPH NODES: No palpable cervical, supraclavicular, axillary or inguinal adenopathy  NEUROLOGICAL: Unremarkable. PSYCH: Appropriate.   Appointment on 02/02/2016  Component Date Value Ref Range Status  . WBC 02/02/2016 4.4  3.6 - 11.0 K/uL Final  . RBC 02/02/2016 5.00  3.80 - 5.20 MIL/uL Final  . Hemoglobin 02/02/2016 15.4  12.0 - 16.0 g/dL Final  . HCT 02/02/2016 44.3  35.0 - 47.0 % Final  . MCV 02/02/2016 88.7  80.0 - 100.0 fL Final  . MCH 02/02/2016 30.9  26.0 - 34.0 pg Final  . MCHC 02/02/2016 34.8  32.0 - 36.0 g/dL Final  . RDW 02/02/2016 13.3  11.5 - 14.5 % Final  . Platelets 02/02/2016 275  150 - 440 K/uL Final  . Neutrophils Relative % 02/02/2016 45   Final  . Neutro Abs 02/02/2016 2.0  1.4 - 6.5 K/uL Final  . Lymphocytes Relative 02/02/2016 44   Final  . Lymphs Abs 02/02/2016 2.0  1.0 - 3.6 K/uL Final  . Monocytes Relative 02/02/2016 9   Final  . Monocytes Absolute 02/02/2016 0.4  0.2 - 0.9 K/uL Final  . Eosinophils Relative 02/02/2016 2   Final  . Eosinophils Absolute 02/02/2016 0.1  0 - 0.7 K/uL Final  . Basophils Relative 02/02/2016 0   Final  . Basophils Absolute 02/02/2016 0.0  0 - 0.1 K/uL Final  . Sodium 02/02/2016 135  135 - 145 mmol/L Final  . Potassium 02/02/2016 3.6  3.5 - 5.1 mmol/L Final  . Chloride 02/02/2016 106  101 - 111 mmol/L Final  . CO2 02/02/2016 24  22 - 32 mmol/L Final  . Glucose, Bld 02/02/2016 106* 65 - 99 mg/dL Final  . BUN 02/02/2016 12  6 - 20 mg/dL Final  . Creatinine, Ser 02/02/2016 0.43* 0.44 - 1.00 mg/dL Final  . Calcium 02/02/2016 8.7* 8.9 - 10.3 mg/dL Final  . Total Protein 02/02/2016 7.2  6.5 - 8.1 g/dL Final  . Albumin 02/02/2016 4.2  3.5 - 5.0 g/dL Final  . AST 02/02/2016 25  15 - 41 U/L Final  . ALT 02/02/2016 38  14 - 54 U/L Final  .  Alkaline Phosphatase 02/02/2016 72  38 - 126 U/L Final  . Total Bilirubin 02/02/2016 1.0  0.3 - 1.2 mg/dL Final  . GFR calc non Af Amer 02/02/2016 >60  >60 mL/min Final  . GFR calc Af Amer 02/02/2016 >60  >60 mL/min Final   Comment: (NOTE) The eGFR has been calculated using the CKD EPI equation. This calculation has not been validated in all clinical situations. eGFR's persistently <60 mL/min signify possible Chronic Kidney Disease.   . Anion gap 02/02/2016 5  5 - 15 Final    Assessment:  Lashea Goda is a 40 y.o. female with left portal vein thrombosis. She is heterozygote for Factor V Leiden.  History is negative for smoking, birth control pills, abdominal surgeries, trauma, inflammatory bowel disease, pancreatitis, and pregnancy.  She has no family history of any blood disorders or thrombosis.  She is on life long anticoagulation.  Chest CT angiogram on 11/20/2015 revealed no evidence of pulmonary embolism. Abdominal and pelvic CT scan on 11/20/2015 revealed  left portal vein thrombosis beginning at the bifurcation with vessel expansion and differential liver enhancement.  Hypercoagulable work-up revealed heterozygosity for Factor V Leiden (single R506Q mutation) and prothrombin gene mutation (single G-[id number] mutation).  Protein S-functional was low (48%) and possibily due to acute thrombosis.  The following studies were negative:  lupus anticoagulant panel, anti-cardiolipin antibodies, beta2-glycoprotein, flow cytometry for PNH, JAK2 V617F, protein C antigen  (82%), protein C-functional (109%), protein S total (117%).  Symptomatically, she feels good.  She has had no abdominal pain in 3 weeks.  She is eating healthier.  She has lost 5 pounds in the past month.  Exam is stable.    Plan: 1.  Labs today:  CBC with diff, CMP, protein S activity. 2.  Continue Eliquis 5 mg BID. (covered through 01/05/2017). 3.  Contact clinic if any interval concerns. 4.  RTC in 3 months for MD assessment  and labs (CBC with diff, CMP).   Lequita Asal, MD  02/02/2016, 11:31 AM

## 2016-02-02 NOTE — Progress Notes (Signed)
Patient here for F/U visit. She received her eliquis via [employer]. She takes 5 mg tablet twice a day. Patient getting patient assistance from [employer].Patient has lost 10 lbs since last visit. She is trying to lose weight so she will feel better. The eliquis has lowered her appetite as well. Eating healthy fruits and vegetables. No pain in 3 weeks. Had 1 small episode of L sided nose bleed that stopped on its own. Interpreter present for visit with MD.

## 2016-02-03 LAB — PROTEIN S ACTIVITY: Protein S Activity: 107 % (ref 63–140)

## 2016-05-02 NOTE — Progress Notes (Signed)
[facility]-  Cancer Center  Clinic day:  05/03/16  Chief Complaint: Kristina Eaton is a 40 y.o. 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  HPI:  The patient was last seen in the hematology clinic on 02/02/2016.  At that time, she felt good.  She had no abdominal pain in 3 weeks.  She was eating healthier.  She had lost 5 pounds in the past month.  Exam was stable.   Labs included a normal CBC with diff, CMP (except for a calcium of 8.7), and protein S activity (107%).  She was to continue her Eliquis.  In the interim, she has felt "about the same".  She has noted a rash on her face about 4-5 times that clears up spontaneously.  She last had a rash was 1 1/2 months ago.  Her boyfriend thinks it is something that she eats. It does not appear related to her Eliquis.   History reviewed. No pertinent past medical history.  Past Surgical History:  Procedure Laterality Date  . none      Family History  Problem Relation Age of Onset  . Diabetes Brother   . Hypertension Sister     Social History:  reports that she has never smoked. She does not have any smokeless tobacco history on file. She reports that she does not drink alcohol or use drugs.  She reports that she does not drink alcohol or use illicit drugs. She is from [place]. She has 3 children. She is accompanied by her boyfriend and the interpreter today.  Allergies: No Known Allergies  Current Medications: Current Outpatient Prescriptions  Medication Sig Dispense Refill  . apixaban (ELIQUIS) 5 MG TABS tablet Take 1 tablet (5 mg total) by mouth 2 (two) times daily. 180 tablet 3  . HYDROcodone-acetaminophen (NORCO/VICODIN) 5-325 MG tablet Take 1-2 tablets by mouth every 4 (four) hours as needed for moderate pain. 30 tablet 0   No current facility-administered medications for this visit.     Review of Systems:   GENERAL: Feels "fine". Active. No fevers or sweats.  Weight up 5 pounds. PERFORMANCE STATUS (ECOG): 1 HEENT: No visual changes, runny nose, sore throat, mouth sores or tenderness. Lungs:  No shortness of breath or cough. No hemoptysis. Cardiac: No chest pain, palpitations, orthopnea, or PND. GI: No abdominal pain.  Acid reflux.  No nausea, vomiting, diarrhea, constipation, melena or hematochezia. GU: No urgency, frequency, dysuria, or hematuria. Musculoskeletal: Joints (fingers, wrist, elbows, shoulder) hurt.No back pain. No muscle tenderness. Extremities: No pain or swelling. Skin: Episodic rash (4-5 episodes) on face possibly related to food. Neuro: No headache, numbness or weakness, balance or coordination issues. Endocrine: No diabetes, thyroid issues, hot flashes or night sweats. Psych: No mood changes, depression or anxiety. Pain: No focal pain. Review of systems: All other systems reviewed and found to be negative  Physical Exam: Blood pressure 111/69, pulse 66, temperature 97.1 F (36.2 C), temperature source Tympanic, weight 220 lb 14.4 oz (100.2 kg). GENERAL: Well developed, well nourished, heavyset woman sitting comfortably in the exam room in no acute distress. MENTAL STATUS: Alert and oriented to person, place and time. HEAD: Long brown hair. Normocephalic, atraumatic, face symmetric, no Cushingoid features. EYES: Brown eyes. Pupils equal round and reactive to light and accomodation. No conjunctivitis or scleral icterus. ENT: Oropharynx clear without lesion. Tongue normal. Mucous membranes moist.  RESPIRATORY: Clear to auscultation without rales, wheezes  or rhonchi. CARDIOVASCULAR: Regular rate and rhythm without murmur, rub or gallop. ABDOMEN: Soft, non-tender with active bowel sounds and no appreciable hepatosplenomegaly. No masses. SKIN: No rashes, ulcers or lesions. EXTREMITIES:  No edema, no skin discoloration or tenderness. No  palpable cords. LYMPH NODES: No palpable cervical, supraclavicular, axillary or inguinal adenopathy  NEUROLOGICAL: Unremarkable. PSYCH: Appropriate.   Appointment on 05/03/2016  Component Date Value Ref Range Status  . WBC 05/03/2016 4.5  3.6 - 11.0 K/uL Final  . RBC 05/03/2016 4.73  3.80 - 5.20 MIL/uL Final  . Hemoglobin 05/03/2016 14.7  12.0 - 16.0 g/dL Final  . HCT 95/28/4132 42.3  35.0 - 47.0 % Final  . MCV 05/03/2016 89.5  80.0 - 100.0 fL Final  . MCH 05/03/2016 31.2  26.0 - 34.0 pg Final  . MCHC 05/03/2016 34.9  32.0 - 36.0 g/dL Final  . RDW 44/07/270 13.1  11.5 - 14.5 % Final  . Platelets 05/03/2016 258  150 - 440 K/uL Final  . Neutrophils Relative % 05/03/2016 44  % Final  . Neutro Abs 05/03/2016 2.0  1.4 - 6.5 K/uL Final  . Lymphocytes Relative 05/03/2016 43  % Final  . Lymphs Abs 05/03/2016 1.9  1.0 - 3.6 K/uL Final  . Monocytes Relative 05/03/2016 9  % Final  . Monocytes Absolute 05/03/2016 0.4  0.2 - 0.9 K/uL Final  . Eosinophils Relative 05/03/2016 3  % Final  . Eosinophils Absolute 05/03/2016 0.1  0 - 0.7 K/uL Final  . Basophils Relative 05/03/2016 1  % Final  . Basophils Absolute 05/03/2016 0.0  0 - 0.1 K/uL Final    Assessment:  Kristina Eaton is a 40 y.o. female with left portal vein thrombosis. 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  History is negative for smoking, birth control pills, abdominal surgeries, trauma, inflammatory bowel disease, pancreatitis, and pregnancy.  She has no family history of any blood disorders or thrombosis.  She is on life long anticoagulation.  She is on Eliquis.  Chest CT angiogram on 11/20/2015 revealed no evidence of pulmonary embolism. Abdominal and pelvic CT scan on 11/20/2015 revealed left portal vein thrombosis beginning at the bifurcation with vessel expansion and differential liver enhancement.  Hypercoagulable work-up revealed heterozygosity for Factor V Leiden  (single R506Q mutation) and prothrombin gene mutation (single G-[id number] mutation).  Protein S-functional was low (48%) initially secondary to acute thrombosis.  Repeat protein S activity was 107% on 02/02/2016.  The following studies were negative:  lupus anticoagulant panel, anti-cardiolipin antibodies, beta2-glycoprotein, flow cytometry for PNH, JAK2 V617F, protein C antigen  (82%), protein C-functional (109%), protein S total (117%).  Symptomatically, she feels good.  She denies any abdominal pain.  She has had an episodic rash which may be related to something she eats.  Exam is stable.    Plan: 1.  Labs today:  CBC with diff, CMP. 2.  Continue Eliquis 5 mg BID. (covered through 01/05/2017). 3.  Contact clinic during interim if any interval concerns. 4.  RTC in 3 months for MD assessment and labs (CBC with diff, CMP).   Rosey Bath, MD  05/03/2016, 10:15 AM

## 2016-05-03 ENCOUNTER — Inpatient Hospital Stay (HOSPITAL_BASED_OUTPATIENT_CLINIC_OR_DEPARTMENT_OTHER): Payer: Self-pay | Admitting: Hematology and Oncology

## 2016-05-03 ENCOUNTER — Inpatient Hospital Stay: Payer: Self-pay | Attending: Hematology and Oncology

## 2016-05-03 ENCOUNTER — Encounter: Payer: Self-pay | Admitting: Hematology and Oncology

## 2016-05-03 ENCOUNTER — Encounter (INDEPENDENT_AMBULATORY_CARE_PROVIDER_SITE_OTHER): Payer: Self-pay

## 2016-05-03 ENCOUNTER — Other Ambulatory Visit: Payer: Self-pay | Admitting: *Deleted

## 2016-05-03 VITALS — BP 111/69 | HR 66 | Temp 97.1°F | Wt 220.9 lb

## 2016-05-03 DIAGNOSIS — I81 Portal vein thrombosis: Secondary | ICD-10-CM | POA: Insufficient documentation

## 2016-05-03 DIAGNOSIS — Z7901 Long term (current) use of anticoagulants: Secondary | ICD-10-CM | POA: Insufficient documentation

## 2016-05-03 DIAGNOSIS — D6851 Activated protein C resistance: Secondary | ICD-10-CM

## 2016-05-03 DIAGNOSIS — D6852 Prothrombin gene mutation: Secondary | ICD-10-CM | POA: Insufficient documentation

## 2016-05-03 DIAGNOSIS — Z79899 Other long term (current) drug therapy: Secondary | ICD-10-CM

## 2016-05-03 LAB — CBC WITH DIFFERENTIAL/PLATELET
Basophils Absolute: 0 10*3/uL (ref 0–0.1)
Basophils Relative: 1 %
Eosinophils Absolute: 0.1 10*3/uL (ref 0–0.7)
Eosinophils Relative: 3 %
HCT: 42.3 % (ref 35.0–47.0)
Hemoglobin: 14.7 g/dL (ref 12.0–16.0)
Lymphocytes Relative: 43 %
Lymphs Abs: 1.9 10*3/uL (ref 1.0–3.6)
MCH: 31.2 pg (ref 26.0–34.0)
MCHC: 34.9 g/dL (ref 32.0–36.0)
MCV: 89.5 fL (ref 80.0–100.0)
Monocytes Absolute: 0.4 10*3/uL (ref 0.2–0.9)
Monocytes Relative: 9 %
Neutro Abs: 2 10*3/uL (ref 1.4–6.5)
Neutrophils Relative %: 44 %
Platelets: 258 10*3/uL (ref 150–440)
RBC: 4.73 MIL/uL (ref 3.80–5.20)
RDW: 13.1 % (ref 11.5–14.5)
WBC: 4.5 10*3/uL (ref 3.6–11.0)

## 2016-05-03 LAB — COMPREHENSIVE METABOLIC PANEL
ALT: 40 U/L (ref 14–54)
AST: 28 U/L (ref 15–41)
Albumin: 4.1 g/dL (ref 3.5–5.0)
Alkaline Phosphatase: 85 U/L (ref 38–126)
Anion gap: 6 (ref 5–15)
BUN: 11 mg/dL (ref 6–20)
CO2: 24 mmol/L (ref 22–32)
Calcium: 9 mg/dL (ref 8.9–10.3)
Chloride: 107 mmol/L (ref 101–111)
Creatinine, Ser: 0.61 mg/dL (ref 0.44–1.00)
GFR calc Af Amer: >60 mL/min (ref >60)
GFR calc non Af Amer: >60 mL/min (ref >60)
Glucose, Bld: 95 mg/dL (ref 65–99)
Potassium: 4.1 mmol/L (ref 3.5–5.1)
Sodium: 137 mmol/L (ref 135–145)
Total Bilirubin: 0.7 mg/dL (ref 0.3–1.2)
Total Protein: 7.4 g/dL (ref 6.5–8.1)

## 2016-05-03 NOTE — Progress Notes (Signed)
Patient states she noticed redness on her face after taking Eliquis. The more of the medication she has taken the redness has spread onto her body.  Patient has a picture on her cell phone to share with MD.  Also states she has had a blood vessel burst in her left eye just this morning. No problems with vision.

## 2016-05-09 ENCOUNTER — Emergency Department
Admission: EM | Admit: 2016-05-09 | Discharge: 2016-05-09 | Disposition: A | Discharge status: Home / Self Care | Payer: Self-pay | Attending: Emergency Medicine | Admitting: Emergency Medicine

## 2016-05-09 ENCOUNTER — Emergency Department: Payer: Self-pay

## 2016-05-09 ENCOUNTER — Other Ambulatory Visit: Payer: Self-pay

## 2016-05-09 DIAGNOSIS — R110 Nausea: Secondary | ICD-10-CM | POA: Insufficient documentation

## 2016-05-09 DIAGNOSIS — R109 Unspecified abdominal pain: Secondary | ICD-10-CM

## 2016-05-09 DIAGNOSIS — Z791 Long term (current) use of non-steroidal anti-inflammatories (NSAID): Secondary | ICD-10-CM | POA: Insufficient documentation

## 2016-05-09 DIAGNOSIS — R0602 Shortness of breath: Secondary | ICD-10-CM | POA: Insufficient documentation

## 2016-05-09 DIAGNOSIS — Z79899 Other long term (current) drug therapy: Secondary | ICD-10-CM | POA: Insufficient documentation

## 2016-05-09 DIAGNOSIS — R1010 Upper abdominal pain, unspecified: Secondary | ICD-10-CM

## 2016-05-09 DIAGNOSIS — R079 Chest pain, unspecified: Secondary | ICD-10-CM | POA: Insufficient documentation

## 2016-05-09 DIAGNOSIS — R1011 Right upper quadrant pain: Secondary | ICD-10-CM | POA: Insufficient documentation

## 2016-05-09 LAB — COMPREHENSIVE METABOLIC PANEL
ALBUMIN: 4.2 g/dL (ref 3.5–5.0)
ALT: 39 U/L (ref 14–54)
AST: 31 U/L (ref 15–41)
Alkaline Phosphatase: 89 U/L (ref 38–126)
Anion gap: 8 (ref 5–15)
BILIRUBIN TOTAL: 0.5 mg/dL (ref 0.3–1.2)
BUN: 14 mg/dL (ref 6–20)
CO2: 22 mmol/L (ref 22–32)
CREATININE: 0.66 mg/dL (ref 0.44–1.00)
Calcium: 9.4 mg/dL (ref 8.9–10.3)
Chloride: 108 mmol/L (ref 101–111)
GFR calc Af Amer: >60 mL/min (ref >60)
GFR calc non Af Amer: >60 mL/min (ref >60)
GLUCOSE: 113 mg/dL — AB (ref 65–99)
POTASSIUM: 3.7 mmol/L (ref 3.5–5.1)
Sodium: 138 mmol/L (ref 135–145)
TOTAL PROTEIN: 7.5 g/dL (ref 6.5–8.1)

## 2016-05-09 LAB — CBC
HEMATOCRIT: 43.4 % (ref 35.0–47.0)
HEMOGLOBIN: 14.9 g/dL (ref 12.0–16.0)
MCH: 31.2 pg (ref 26.0–34.0)
MCHC: 34.4 g/dL (ref 32.0–36.0)
MCV: 90.7 fL (ref 80.0–100.0)
Platelets: 293 10*3/uL (ref 150–440)
RBC: 4.78 MIL/uL (ref 3.80–5.20)
RDW: 13.3 % (ref 11.5–14.5)
WBC: 8.5 10*3/uL (ref 3.6–11.0)

## 2016-05-09 LAB — TROPONIN I: Troponin I: <0.03 ng/mL (ref <0.03)

## 2016-05-09 LAB — PREGNANCY, URINE: Preg Test, Ur: NEGATIVE

## 2016-05-09 LAB — LIPASE, BLOOD: Lipase: 27 U/L (ref 11–51)

## 2016-05-09 MED ORDER — MORPHINE SULFATE (PF) 4 MG/ML IV SOLN: 4.0000 mg | Freq: Once | INTRAVENOUS | Status: DC

## 2016-05-09 MED ORDER — IOPAMIDOL (ISOVUE-370) INJECTION 76%
75.0000 mL | Freq: Once | INTRAVENOUS | Status: AC | PRN
  Administered 2016-05-09: 75 mL via INTRAVENOUS

## 2016-05-09 MED ORDER — MORPHINE SULFATE (PF) 2 MG/ML IV SOLN
INTRAVENOUS | Status: AC
Start: 2016-05-09 — End: 2016-05-09
  Administered 2016-05-09: 4 mg via INTRAMUSCULAR
  Filled 2016-05-09: qty 2

## 2016-05-09 MED ORDER — ETODOLAC 200 MG PO CAPS: 200.0000 mg | ORAL_CAPSULE | Freq: Three times a day (TID) | ORAL | 0 refills | Status: AC

## 2016-05-09 MED ORDER — MORPHINE SULFATE (PF) 2 MG/ML IV SOLN
INTRAVENOUS | Status: AC
  Administered 2016-05-09: 4 mg via INTRAMUSCULAR
  Filled 2016-05-09: qty 2

## 2016-05-09 MED ORDER — ONDANSETRON HCL 4 MG/2ML IJ SOLN
INTRAMUSCULAR | Status: AC
  Administered 2016-05-09: 4 mg via INTRAVENOUS
  Filled 2016-05-09: qty 2

## 2016-05-09 MED ORDER — GI COCKTAIL ~~LOC~~
30.0000 mL | Freq: Once | ORAL | Status: AC
  Administered 2016-05-09: 30 mL via ORAL
  Filled 2016-05-09: qty 30

## 2016-05-09 MED ORDER — SODIUM CHLORIDE 0.9 % IV BOLUS (SEPSIS)
1000.0000 mL | Freq: Once | INTRAVENOUS | Status: AC
  Administered 2016-05-09: 1000 mL via INTRAVENOUS

## 2016-05-09 MED ORDER — ONDANSETRON HCL 4 MG/2ML IJ SOLN
4.0000 mg | Freq: Once | INTRAMUSCULAR | Status: AC
  Administered 2016-05-09: 4 mg via INTRAVENOUS

## 2016-05-09 MED ORDER — ONDANSETRON HCL 4 MG/2ML IJ SOLN
4.0000 mg | Freq: Once | INTRAMUSCULAR | Status: AC
  Administered 2016-05-09: 4 mg via INTRAVENOUS
  Filled 2016-05-09: qty 2

## 2016-05-09 NOTE — ED Provider Notes (Signed)
[facility] Emergency Department Provider Note   ____________________________________________   First MD Initiated Contact with Patient 05/09/16 0131     (approximate)  I have reviewed the triage vital signs and the nursing notes.   HISTORY  Chief Complaint Abdominal Pain and Shortness of Breath    HPI Kristina Eaton is a 40 y.o. female who comes into the hospital today with pain in her epigastric and right upper quadrant. The patient reports that she's been treated for a clot on her liver in the past. The patient started complaining of pain today. The patient started around 11 PM. The patient had eaten dinner around 5 PM. The patient ate beef soup. She's had nausea with no vomiting and rates her pain 8 out of 10 in intensity.The patient also endorses some chest pain and difficulty breathing. The patient reports that the pain is worse when she takes a deep inspiration. The patient has had this pain in the past when she had a clot in her liver. She is currently taking Eliquis. The patient denies any fevers. She did have some pain in her back as well. She is here for evaluation.   History reviewed. No pertinent past medical history.  Patient Active Problem List   Diagnosis Date Noted  . Factor V Leiden mutation (HCC) 01/02/2016  . Prothrombin gene mutation (HCC) 01/02/2016  . Dyspnea 11/22/2015  . Pleuritic chest pain 11/22/2015  . Obesity 11/22/2015  . Abdominal pain 11/20/2015  . Portal vein thrombosis 11/20/2015    Past Surgical History:  Procedure Laterality Date  . none      Prior to Admission medications   Medication Sig Start Date End Date Taking? Authorizing Provider  apixaban (ELIQUIS) 5 MG TABS tablet Take 1 tablet (5 mg total) by mouth 2 (two) times daily. 01/02/16   Rosey BathMelissa C Corcoran, MD  etodolac (LODINE) 200 MG capsule Take 1 capsule (200 mg total) by mouth every 8 (eight) hours. 05/09/16   Rebecka ApleyAllison P Mishawn Hemann, MD    HYDROcodone-acetaminophen (NORCO/VICODIN) 5-325 MG tablet Take 1-2 tablets by mouth every 4 (four) hours as needed for moderate pain. 11/22/15   Katharina Caperima Vaickute, MD    Allergies Review of patient's allergies indicates no known allergies.  Family History  Problem Relation Age of Onset  . Diabetes Brother   . Hypertension Sister     Social History Social History  Substance Use Topics  . Smoking status: Never Smoker  . Smokeless tobacco: Not on file  . Alcohol use No    Review of Systems Constitutional: No fever/chills Eyes: No visual changes. ENT: No sore throat. Cardiovascular:  chest pain. Respiratory:shortness of breath. Gastrointestinal:  abdominal pain,  nausea, no vomiting.  No diarrhea.  No constipation. Genitourinary: Negative for dysuria. Musculoskeletal: Negative for back pain. Skin: Negative for rash. Neurological: Negative for headaches, focal weakness or numbness.  10-point ROS otherwise negative.  ____________________________________________   PHYSICAL EXAM:  VITAL SIGNS: ED Triage Vitals  Enc Vitals Group     BP 05/09/16 0059 120/73     Pulse Rate 05/09/16 0059 72     Resp 05/09/16 0059 (!) 26     Temp 05/09/16 0059 97.9 F (36.6 C)     Temp Source 05/09/16 0059 Oral     SpO2 05/09/16 0059 98 %     Weight --      Height --      Head Circumference --      Peak Flow --  Pain Score 05/09/16 0100 10     Pain Loc --      Pain Edu? --      Excl. in GC? --     Constitutional: Alert and oriented. Well appearing and in moderate distress. Eyes: Conjunctivae are normal. PERRL. EOMI. Head: Atraumatic. Nose: No congestion/rhinnorhea. Mouth/Throat: Mucous membranes are moist.  Oropharynx non-erythematous. Cardiovascular: Normal rate, regular rhythm. Grossly normal heart sounds.  Good peripheral circulation. Respiratory: Normal respiratory effort.  No retractions. Lungs CTAB. Gastrointestinal: Soft with epigastric and RUQ tendernss to palpation. No  distention. Positive bowel sounds Musculoskeletal: No lower extremity tenderness nor edema.   Neurologic:  Normal speech and language. Skin:  Skin is warm, dry and intact.  Psychiatric: Mood and affect are normal.   ____________________________________________   LABS (all labs ordered are listed, but only abnormal results are displayed)  Labs Reviewed  COMPREHENSIVE METABOLIC PANEL - Abnormal; Notable for the following:       Result Value   Glucose, Bld 113 (*)    All other components within normal limits  CBC  TROPONIN I  LIPASE, BLOOD  TROPONIN I  PREGNANCY, URINE   ____________________________________________  EKG  ED ECG REPORT I, Rebecka Apley, the attending physician, personally viewed and interpreted this ECG.   Date: 05/09/2016  EKG Time: 700  Rate: 54  Rhythm: normal sinus rhythm  Axis: normal  Intervals:none  ST&T Change: none  ____________________________________________  RADIOLOGY  CT chest [place] RUQ ____________________________________________   PROCEDURES  Procedure(s) performed: None  Procedures  Critical Care performed: No  ____________________________________________   INITIAL IMPRESSION / ASSESSMENT AND PLAN / ED COURSE  Pertinent labs & imaging results that were available during my care of the patient were reviewed by me and considered in my medical decision making (see chart for details).  This is a 40 year old female who comes into the hospital today with some epigastric, right upper quadrant pain and pain into her chest. The patient did receive a dose of morphine for her pain initially. I did feel that the patient needed an ultrasound. The patient's blood work is unremarkable at this time. I will await the results of the ultrasound and then repeat the patient's troponin.  Clinical Course  Value Comment By Time  CT Angio Chest PE W and/or Wo Contrast No pulmonary embolus or acute intrathoracic process. Rebecka Apley, MD  10/15 [phone]  US Abdomen Limited RUQ Normal right upper quadrant ultrasound Rebecka Apley, MD 10/15 [phone]    The patient's CT did not show a PE and her ultrasound was negative as well. I will have the patient reassessed the situation. If the patient's pain is improved she'll be discharged home to follow-up with her primary care physician.  ____________________________________________   FINAL CLINICAL IMPRESSION(S) / ED DIAGNOSES  Final diagnoses:  Abdominal pain  Pain of upper abdomen      NEW MEDICATIONS STARTED DURING THIS VISIT:  New Prescriptions   ETODOLAC (LODINE) 200 MG CAPSULE    Take 1 capsule (200 mg total) by mouth every 8 (eight) hours.     Note:  This document was prepared using Dragon voice recognition software and may include unintentional dictation errors.    Rebecka Apley, MD 05/09/16 [phone]

## 2016-05-09 NOTE — ED Triage Notes (Signed)
Family reports patient complaining of left upper quad pain and shortness of breath for couple hours.  Patient diagnosed with blood clot in liver approximately 3 months.

## 2016-05-09 NOTE — ED Notes (Signed)
Assisted pt up to BR

## 2016-05-09 NOTE — ED Notes (Signed)
Patient's discharge and follow up information reviewed with patient by ED nursing staff and patient given the opportunity to ask questions pertaining to ED visit and discharge plan of care. Patient advised that should symptoms not continue to improve, resolve entirely, or should new symptoms develop then a follow up visit with their PCP or a return visit to the ED may be warranted. Patient verbalized consent and understanding of discharge plan of care including potential need for further evaluation. Patient discharged in stable condition per attending ED physician on duty.  Interpretation provided by Visual Remote Interpreter.

## 2016-05-16 ENCOUNTER — Encounter: Payer: Self-pay | Admitting: Hematology and Oncology

## 2016-08-01 NOTE — Progress Notes (Deleted)
[place] Clinic day:  08/01/16  Chief Complaint: Kristina Eaton is a 41 y.o. 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  HPI:  The patient was last seen in the hematology clinic on 05/03/2016.  At that time, she felt good.  She denied any abdominal pain.  She had an episodic rash which appeared related to something she eats.  Labs included a normal CBC with diff and CMP.  She continued her Eliquis.  During the interim,    No past medical history on file.  Past Surgical History:  Procedure Laterality Date  . none      Family History  Problem Relation Age of Onset  . Diabetes Brother   . Hypertension Sister     Social History:  reports that she has never smoked. She does not have any smokeless tobacco history on file. She reports that she does not drink alcohol or use drugs.  She reports that she does not drink alcohol or use illicit drugs. She is from [place]. She has 3 children. She is accompanied by her boyfriend and the interpreter today.  Allergies: No Known Allergies  Current Medications: Current Outpatient Prescriptions  Medication Sig Dispense Refill  . apixaban (ELIQUIS) 5 MG TABS tablet Take 1 tablet (5 mg total) by mouth 2 (two) times daily. 180 tablet 3  . etodolac (LODINE) 200 MG capsule Take 1 capsule (200 mg total) by mouth every 8 (eight) hours. 12 capsule 0  . HYDROcodone-acetaminophen (NORCO/VICODIN) 5-325 MG tablet Take 1-2 tablets by mouth every 4 (four) hours as needed for moderate pain. 30 tablet 0   No current facility-administered medications for this visit.     Review of Systems:  GENERAL: Feels "fine". Active. No fevers or sweats.  Weight up 5 pounds. PERFORMANCE STATUS (ECOG): 1 HEENT: No visual changes, runny nose, sore throat, mouth sores or tenderness. Lungs:  No shortness of breath or cough. No  hemoptysis. Cardiac: No chest pain, palpitations, orthopnea, or PND. GI: No abdominal pain.  Acid reflux.  No nausea, vomiting, diarrhea, constipation, melena or hematochezia. GU: No urgency, frequency, dysuria, or hematuria. Musculoskeletal: Joints (fingers, wrist, elbows, shoulder) hurt.No back pain. No muscle tenderness. Extremities: No pain or swelling. Skin: Episodic rash (4-5 episodes) on face possibly related to food. Neuro: No headache, numbness or weakness, balance or coordination issues. Endocrine: No diabetes, thyroid issues, hot flashes or night sweats. Psych: No mood changes, depression or anxiety. Pain: No focal pain. Review of systems: All other systems reviewed and found to be negative  Physical Exam: There were no vitals taken for this visit. GENERAL: Well developed, well nourished, heavyset woman sitting comfortably in the exam room in no acute distress. MENTAL STATUS: Alert and oriented to person, place and time. HEAD: Long brown hair. Normocephalic, atraumatic, face symmetric, no Cushingoid features. EYES: Brown eyes. Pupils equal round and reactive to light and accomodation. No conjunctivitis or scleral icterus. ENT: Oropharynx clear without lesion. Tongue normal. Mucous membranes moist.  RESPIRATORY: Clear to auscultation without rales, wheezes or rhonchi. CARDIOVASCULAR: Regular rate and rhythm without murmur, rub or gallop. ABDOMEN: Soft, non-tender with active bowel sounds and no appreciable hepatosplenomegaly. No masses. SKIN: No rashes, ulcers or lesions. EXTREMITIES:  No edema, no skin discoloration or tenderness. No palpable cords. LYMPH NODES: No palpable cervical, supraclavicular, axillary or inguinal adenopathy  NEUROLOGICAL: Unremarkable. PSYCH: Appropriate.   No visits with  results within 3 Day(s) from this visit.  Latest known visit with results is:  Admission on 05/09/2016, Discharged on 05/09/2016  Component Date Value  Ref Range Status  . WBC 05/09/2016 8.5  3.6 - 11.0 K/uL Final  . RBC 05/09/2016 4.78  3.80 - 5.20 MIL/uL Final  . Hemoglobin 05/09/2016 14.9  12.0 - 16.0 g/dL Final  . HCT 05/09/2016 43.4  35.0 - 47.0 % Final  . MCV 05/09/2016 90.7  80.0 - 100.0 fL Final  . MCH 05/09/2016 31.2  26.0 - 34.0 pg Final  . MCHC 05/09/2016 34.4  32.0 - 36.0 g/dL Final  . RDW 05/09/2016 13.3  11.5 - 14.5 % Final  . Platelets 05/09/2016 293  150 - 440 K/uL Final  . Sodium 05/09/2016 138  135 - 145 mmol/L Final  . Potassium 05/09/2016 3.7  3.5 - 5.1 mmol/L Final  . Chloride 05/09/2016 108  101 - 111 mmol/L Final  . CO2 05/09/2016 22  22 - 32 mmol/L Final  . Glucose, Bld 05/09/2016 113* 65 - 99 mg/dL Final  . BUN 05/09/2016 14  6 - 20 mg/dL Final  . Creatinine, Ser 05/09/2016 0.66  0.44 - 1.00 mg/dL Final  . Calcium 05/09/2016 9.4  8.9 - 10.3 mg/dL Final  . Total Protein 05/09/2016 7.5  6.5 - 8.1 g/dL Final  . Albumin 05/09/2016 4.2  3.5 - 5.0 g/dL Final  . AST 05/09/2016 31  15 - 41 U/L Final  . ALT 05/09/2016 39  14 - 54 U/L Final  . Alkaline Phosphatase 05/09/2016 89  38 - 126 U/L Final  . Total Bilirubin 05/09/2016 0.5  0.3 - 1.2 mg/dL Final  . GFR calc non Af Amer 05/09/2016 >60  >60 mL/min Final  . GFR calc Af Amer 05/09/2016 >60  >60 mL/min Final   Comment: (NOTE) The eGFR has been calculated using the CKD EPI equation. This calculation has not been validated in all clinical situations. eGFR's persistently <60 mL/min signify possible Chronic Kidney Disease.   . Anion gap 05/09/2016 8  5 - 15 Final  . Troponin I 05/09/2016 <0.03  <0.03 ng/mL Final  . Lipase 05/09/2016 27  11 - 51 U/L Final  . Preg Test, Ur 05/09/2016 NEGATIVE  NEGATIVE Final  . Troponin I 05/09/2016 <0.03  <0.03 ng/mL Final    Assessment:  Kristina Eaton is a 41 y.o. female with left portal vein thrombosis. 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  History is negative for  smoking, birth control pills, abdominal surgeries, trauma, inflammatory bowel disease, pancreatitis, and pregnancy.  She has no family history of any blood disorders or thrombosis.  She is on life long anticoagulation.  She is on Eliquis.  Chest CT angiogram on 11/20/2015 revealed no evidence of pulmonary embolism. Abdominal and pelvic CT scan on 11/20/2015 revealed left portal vein thrombosis beginning at the bifurcation with vessel expansion and differential liver enhancement.  Hypercoagulable work-up revealed heterozygosity for Factor V Leiden (single R506Q mutation) and prothrombin gene mutation (single G-20210-A mutation).  Protein S-functional was low (48%) initially secondary to acute thrombosis.  Repeat protein S activity was 107% on 02/02/2016.  The following studies were negative:  lupus anticoagulant panel, anti-cardiolipin antibodies, beta2-glycoprotein, flow cytometry for PNH, JAK2 V617F, protein C antigen  (82%), protein C-functional (109%), protein S total (117%).  Symptomatically, she feels good.  She denies any abdominal pain.  She has had an episodic rash which may be related to something  she eats.  Exam is stable.    Plan: 1.  Labs today:  CBC with diff, CMP. 2.  Continue Eliquis 5 mg BID. (covered through 01/05/2017). 3.  Contact clinic during interim if any interval concerns. 4.  RTC in 3 months for MD assessment and labs (CBC with diff, CMP).   Lequita Asal, MD  08/01/2016, 8:05 PM

## 2016-08-02 ENCOUNTER — Inpatient Hospital Stay: Payer: Self-pay | Admitting: Hematology and Oncology

## 2016-08-02 ENCOUNTER — Inpatient Hospital Stay: Payer: Self-pay

## 2016-08-12 ENCOUNTER — Inpatient Hospital Stay: Payer: Self-pay | Admitting: Hematology and Oncology

## 2016-08-12 ENCOUNTER — Inpatient Hospital Stay: Payer: Self-pay

## 2016-08-27 ENCOUNTER — Encounter: Payer: Self-pay | Admitting: Hematology and Oncology

## 2016-08-27 ENCOUNTER — Inpatient Hospital Stay (HOSPITAL_BASED_OUTPATIENT_CLINIC_OR_DEPARTMENT_OTHER): Payer: Self-pay | Admitting: Hematology and Oncology

## 2016-08-27 ENCOUNTER — Inpatient Hospital Stay: Payer: Self-pay | Attending: Hematology and Oncology

## 2016-08-27 VITALS — BP 108/67 | HR 77 | Temp 96.5°F | Resp 18 | Wt 219.1 lb

## 2016-08-27 DIAGNOSIS — D6851 Activated protein C resistance: Secondary | ICD-10-CM

## 2016-08-27 DIAGNOSIS — N644 Mastodynia: Secondary | ICD-10-CM | POA: Insufficient documentation

## 2016-08-27 DIAGNOSIS — R200 Anesthesia of skin: Secondary | ICD-10-CM

## 2016-08-27 DIAGNOSIS — Z7901 Long term (current) use of anticoagulants: Secondary | ICD-10-CM

## 2016-08-27 DIAGNOSIS — I81 Portal vein thrombosis: Secondary | ICD-10-CM | POA: Insufficient documentation

## 2016-08-27 DIAGNOSIS — D6852 Prothrombin gene mutation: Secondary | ICD-10-CM

## 2016-08-27 LAB — COMPREHENSIVE METABOLIC PANEL
ALT: 22 U/L (ref 14–54)
AST: 20 U/L (ref 15–41)
Albumin: 3.9 g/dL (ref 3.5–5.0)
Alkaline Phosphatase: 66 U/L (ref 38–126)
Anion gap: 6 (ref 5–15)
BUN: 11 mg/dL (ref 6–20)
CO2: 23 mmol/L (ref 22–32)
Calcium: 8.8 mg/dL — ABNORMAL LOW (ref 8.9–10.3)
Chloride: 106 mmol/L (ref 101–111)
Creatinine, Ser: 0.47 mg/dL (ref 0.44–1.00)
GFR calc Af Amer: >60 mL/min (ref >60)
GFR calc non Af Amer: >60 mL/min (ref >60)
Glucose, Bld: 98 mg/dL (ref 65–99)
Potassium: 3.6 mmol/L (ref 3.5–5.1)
Sodium: 135 mmol/L (ref 135–145)
Total Bilirubin: 0.6 mg/dL (ref 0.3–1.2)
Total Protein: 6.9 g/dL (ref 6.5–8.1)

## 2016-08-27 LAB — CBC WITH DIFFERENTIAL/PLATELET
Basophils Absolute: 0 10*3/uL (ref 0–0.1)
Basophils Relative: 1 %
Eosinophils Absolute: 0.1 10*3/uL (ref 0–0.7)
Eosinophils Relative: 3 %
HCT: 41 % (ref 35.0–47.0)
Hemoglobin: 14.1 g/dL (ref 12.0–16.0)
Lymphocytes Relative: 45 %
Lymphs Abs: 2.6 10*3/uL (ref 1.0–3.6)
MCH: 30.6 pg (ref 26.0–34.0)
MCHC: 34.3 g/dL (ref 32.0–36.0)
MCV: 89.3 fL (ref 80.0–100.0)
Monocytes Absolute: 0.6 10*3/uL (ref 0.2–0.9)
Monocytes Relative: 10 %
Neutro Abs: 2.4 10*3/uL (ref 1.4–6.5)
Neutrophils Relative %: 41 %
Platelets: 282 10*3/uL (ref 150–440)
RBC: 4.59 MIL/uL (ref 3.80–5.20)
RDW: 13.3 % (ref 11.5–14.5)
WBC: 5.8 10*3/uL (ref 3.6–11.0)

## 2016-08-27 NOTE — Progress Notes (Signed)
Patient c/o pain in left breast today 7/10.  Also states hands are numb.  Sometimes feels nauseated but does not throw up.

## 2016-08-27 NOTE — Progress Notes (Signed)
[facility] day:  08/27/16  Chief Complaint: Kristina Eaton is a 41 y.o. 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  HPI:  The patient was last seen in the hematology clinic on 05/03/2016.  At that time, she felt good.  She denied any abdominal pain.  She had an episodic rash which appeared related to something she eats.  Labs included a normal CBC with diff and CMP.  She continued her Eliquis.  During the interim, she has felt "good, more or less".  Her rash is gone.  She notes pain in her left breast x 1 week.  She has massaged her breast at night.  Her hands have been numb x 3 days.  She has had a "little neck pain" x 2 months.  She has lost her Medicaid number/card.  She has been a little fatigued.   History reviewed. No pertinent past medical history.  Past Surgical History:  Procedure Laterality Date  . none      Family History  Problem Relation Age of Onset  . Diabetes Brother   . Hypertension Sister     Social History:  reports that she has never smoked. She does not have any smokeless tobacco history on file. She reports that she does not drink alcohol or use drugs.  She reports that she does not drink alcohol or use illicit drugs. She is from [place]. She has 3 children. She is accompanied by her boyfriend, daughter, and the interpreter today.  Allergies: No Known Allergies  Current Medications: Current Outpatient Prescriptions  Medication Sig Dispense Refill  . apixaban (ELIQUIS) 5 MG TABS tablet Take 1 tablet (5 mg total) by mouth 2 (two) times daily. 180 tablet 3  . etodolac (LODINE) 200 MG capsule Take 1 capsule (200 mg total) by mouth every 8 (eight) hours. (Patient not taking: Reported on 08/27/2016) 12 capsule 0  . HYDROcodone-acetaminophen (NORCO/VICODIN) 5-325 MG tablet Take 1-2 tablets by mouth every 4 (four) hours as needed for  moderate pain. (Patient not taking: Reported on 08/27/2016) 30 tablet 0   No current facility-administered medications for this visit.     Review of Systems:  GENERAL: Feels "more or less good".No fevers or sweats.  Weight down 1 pound. PERFORMANCE STATUS (ECOG): 1 HEENT: No visual changes, runny nose, sore throat, mouth sores or tenderness. Lungs:  No shortness of breath or cough. No hemoptysis. Cardiac: No chest pain, palpitations, orthopnea, or PND. GI: No abdominal pain.  No nausea, vomiting, diarrhea, constipation, melena or hematochezia. GU: No urgency, frequency, dysuria, or hematuria. Musculoskeletal: Joints (fingers, wrist, elbows, shoulder) hurt.No back pain.  No muscle tenderness. Extremities: No pain or swelling. Skin: No rashes, ulcers or bruises. Neuro: Hands numb x 3 days.  No headache, weakness, balance or coordination issues. Endocrine: No diabetes, thyroid issues, hot flashes or night sweats. Psych: No mood changes, depression or anxiety. Pain: Left breast pain x 1 week. Review of systems: All other systems reviewed and found to be negative  Physical Exam: Blood pressure 108/67, pulse 77, temperature (!) 96.5 F (35.8 C), temperature source Tympanic, resp. rate 18, weight 219 lb 2.2 oz (99.4 kg). GENERAL: Well developed, well nourished, heavyset woman sitting comfortably in the exam room in no acute distress. MENTAL STATUS: Alert and oriented to person, place and time. HEAD: Long black hair pulled back. Normocephalic, atraumatic, face symmetric, no Cushingoid features. EYES:  Brown eyes. Pupils equal round and reactive to light and accomodation. No conjunctivitis or scleral icterus. ENT: Oropharynx clear without lesion. Tongue normal. Mucous membranes moist.  RESPIRATORY: Clear to auscultation without rales, wheezes or rhonchi. CARDIOVASCULAR: Regular rate and rhythm without murmur, rub or gallop. BREAST:  Left breast without edema, skin  changes or nipple discharge. ABDOMEN: Soft, non-tender with active bowel sounds and no appreciable hepatosplenomegaly. No masses. SKIN: No rashes, ulcers or lesions. EXTREMITIES:  No edema, no skin discoloration or tenderness. No palpable cords. LYMPH NODES: No palpable cervical, supraclavicular, axillary or inguinal adenopathy  NEUROLOGICAL: Bilateral hand grip normal. PSYCH: Appropriate.   Appointment on 08/27/2016  Component Date Value Ref Range Status  . WBC 08/27/2016 5.8  3.6 - 11.0 K/uL Final  . RBC 08/27/2016 4.59  3.80 - 5.20 MIL/uL Final  . Hemoglobin 08/27/2016 14.1  12.0 - 16.0 g/dL Final  . HCT 08/27/2016 41.0  35.0 - 47.0 % Final  . MCV 08/27/2016 89.3  80.0 - 100.0 fL Final  . MCH 08/27/2016 30.6  26.0 - 34.0 pg Final  . MCHC 08/27/2016 34.3  32.0 - 36.0 g/dL Final  . RDW 08/27/2016 13.3  11.5 - 14.5 % Final  . Platelets 08/27/2016 282  150 - 440 K/uL Final  . Neutrophils Relative % 08/27/2016 41  % Final  . Neutro Abs 08/27/2016 2.4  1.4 - 6.5 K/uL Final  . Lymphocytes Relative 08/27/2016 45  % Final  . Lymphs Abs 08/27/2016 2.6  1.0 - 3.6 K/uL Final  . Monocytes Relative 08/27/2016 10  % Final  . Monocytes Absolute 08/27/2016 0.6  0.2 - 0.9 K/uL Final  . Eosinophils Relative 08/27/2016 3  % Final  . Eosinophils Absolute 08/27/2016 0.1  0 - 0.7 K/uL Final  . Basophils Relative 08/27/2016 1  % Final  . Basophils Absolute 08/27/2016 0.0  0 - 0.1 K/uL Final  . Sodium 08/27/2016 135  135 - 145 mmol/L Final  . Potassium 08/27/2016 3.6  3.5 - 5.1 mmol/L Final  . Chloride 08/27/2016 106  101 - 111 mmol/L Final  . CO2 08/27/2016 23  22 - 32 mmol/L Final  . Glucose, Bld 08/27/2016 98  65 - 99 mg/dL Final  . BUN 08/27/2016 11  6 - 20 mg/dL Final  . Creatinine, Ser 08/27/2016 0.47  0.44 - 1.00 mg/dL Final  . Calcium 08/27/2016 8.8* 8.9 - 10.3 mg/dL Final  . Total Protein 08/27/2016 6.9  6.5 - 8.1 g/dL Final  . Albumin 08/27/2016 3.9  3.5 - 5.0 g/dL Final  . AST  08/27/2016 20  15 - 41 U/L Final  . ALT 08/27/2016 22  14 - 54 U/L Final  . Alkaline Phosphatase 08/27/2016 66  38 - 126 U/L Final  . Total Bilirubin 08/27/2016 0.6  0.3 - 1.2 mg/dL Final  . GFR calc non Af Amer 08/27/2016 >60  >60 mL/min Final  . GFR calc Af Amer 08/27/2016 >60  >60 mL/min Final   Comment: (NOTE) The eGFR has been calculated using the CKD EPI equation. This calculation has not been validated in all clinical situations. eGFR's persistently <60 mL/min signify possible Chronic Kidney Disease.   . Anion gap 08/27/2016 6  5 - 15 Final    Assessment:  Kristina Eaton is a 41 y.o. female with left portal vein thrombosis. 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  History is negative for smoking, birth control pills, abdominal surgeries, trauma, inflammatory bowel disease, pancreatitis,  and pregnancy.  She has no family history of any blood disorders or thrombosis.  She is on life long anticoagulation.  She is on Eliquis.  Chest CT angiogram on 11/20/2015 revealed no evidence of pulmonary embolism. Abdominal and pelvic CT scan on 11/20/2015 revealed left portal vein thrombosis beginning at the bifurcation with vessel expansion and differential liver enhancement.  Hypercoagulable work-up revealed heterozygosity for Factor V Leiden (single R506Q mutation) and prothrombin gene mutation (single G-[id number] mutation).  Protein S-functional was low (48%) initially secondary to acute thrombosis.  Repeat protein S activity was 107% on 02/02/2016.  The following studies were negative:  lupus anticoagulant panel, anti-cardiolipin antibodies, beta2-glycoprotein, flow cytometry for PNH, JAK2 V617F, protein C antigen  (82%), protein C-functional (109%), protein S total (117%).  Symptomatically, she has a 3 day history of bilateral hand numbness and a 1 week history of left breast pain.  She denies any abdominal pain.  Her episodic rash has  resolved.  Exam is stable.    Plan: 1.  Labs today:  CBC with diff, CMP. 2.  Continue Eliquis 5 mg BID. (Patient to find Medicaid number/card to refill). 3.  Contact clinic during interim if any interval concerns. 4.  Patient to call [facility] re: breast pain and hand numbness. 5.  RTC in 3 months for MD assessment and labs (CBC with diff, CMP).   Lequita Asal, MD  08/27/2016, 11:00 AM

## 2016-08-29 ENCOUNTER — Encounter: Payer: Self-pay | Admitting: Hematology and Oncology

## 2016-09-29 ENCOUNTER — Ambulatory Visit: Payer: Self-pay | Attending: Oncology

## 2016-11-24 ENCOUNTER — Ambulatory Visit: Payer: Self-pay | Admitting: Internal Medicine

## 2016-11-24 ENCOUNTER — Other Ambulatory Visit: Payer: Self-pay

## 2016-11-25 ENCOUNTER — Inpatient Hospital Stay: Payer: Self-pay | Admitting: Hematology and Oncology

## 2016-11-25 ENCOUNTER — Inpatient Hospital Stay: Payer: Self-pay

## 2016-11-25 NOTE — Progress Notes (Deleted)
[facility] day:  11/25/16  Chief Complaint: Kristina Eaton is a 41 y.o. 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  HPI:  The patient was last seen in the hematology clinic on 08/27/2016.  At that time, she described a 3 day history of bilateral hand numbness and a 1 week history of left breast pain.  She denied any abdominal pain.  Her episodic rash had resolved.  Exam was stable.  Eliquis continued.  During the interim,    No past medical history on file.  Past Surgical History:  Procedure Laterality Date  . none      Family History  Problem Relation Age of Onset  . Diabetes Brother   . Hypertension Sister     Social History:  reports that she has never smoked. She has never used smokeless tobacco. She reports that she does not drink alcohol or use drugs.  She reports that she does not drink alcohol or use illicit drugs. She is from [place]. She has 3 children. She is accompanied by her boyfriend, daughter, and the interpreter today.  Allergies: No Known Allergies  Current Medications: Current Outpatient Prescriptions  Medication Sig Dispense Refill  . apixaban (ELIQUIS) 5 MG TABS tablet Take 1 tablet (5 mg total) by mouth 2 (two) times daily. 180 tablet 3  . etodolac (LODINE) 200 MG capsule Take 1 capsule (200 mg total) by mouth every 8 (eight) hours. (Patient not taking: Reported on 08/27/2016) 12 capsule 0  . HYDROcodone-acetaminophen (NORCO/VICODIN) 5-325 MG tablet Take 1-2 tablets by mouth every 4 (four) hours as needed for moderate pain. (Patient not taking: Reported on 08/27/2016) 30 tablet 0   No current facility-administered medications for this visit.     Review of Systems:  GENERAL: Feels "more or less good".No fevers or sweats.  Weight down 1 pound. PERFORMANCE STATUS (ECOG): 1 HEENT: No visual changes, runny nose, sore  throat, mouth sores or tenderness. Lungs:  No shortness of breath or cough. No hemoptysis. Cardiac: No chest pain, palpitations, orthopnea, or PND. GI: No abdominal pain.  No nausea, vomiting, diarrhea, constipation, melena or hematochezia. GU: No urgency, frequency, dysuria, or hematuria. Musculoskeletal: Joints (fingers, wrist, elbows, shoulder) hurt.No back pain.  No muscle tenderness. Extremities: No pain or swelling. Skin: No rashes, ulcers or bruises. Neuro: Hands numb x 3 days.  No headache, weakness, balance or coordination issues. Endocrine: No diabetes, thyroid issues, hot flashes or night sweats. Psych: No mood changes, depression or anxiety. Pain: Left breast pain x 1 week. Review of systems: All other systems reviewed and found to be negative  Physical Exam: There were no vitals taken for this visit. GENERAL: Well developed, well nourished, heavyset woman sitting comfortably in the exam room in no acute distress. MENTAL STATUS: Alert and oriented to person, place and time. HEAD: Long black hair pulled back. Normocephalic, atraumatic, face symmetric, no Cushingoid features. EYES: Brown eyes. Pupils equal round and reactive to light and accomodation. No conjunctivitis or scleral icterus. ENT: Oropharynx clear without lesion. Tongue normal. Mucous membranes moist.  RESPIRATORY: Clear to auscultation without rales, wheezes or rhonchi. CARDIOVASCULAR: Regular rate and rhythm without murmur, rub or gallop. BREAST:  Left breast without edema, skin changes or nipple discharge. ABDOMEN: Soft, non-tender with active bowel sounds and no appreciable hepatosplenomegaly. No masses. SKIN: No rashes, ulcers or lesions. EXTREMITIES:  No edema, no skin discoloration or  tenderness. No palpable cords. LYMPH NODES: No palpable cervical, supraclavicular, axillary or inguinal adenopathy  NEUROLOGICAL: Bilateral hand grip normal. PSYCH: Appropriate.   No visits with  results within 3 Day(s) from this visit.  Latest known visit with results is:  Appointment on 08/27/2016  Component Date Value Ref Range Status  . WBC 08/27/2016 5.8  3.6 - 11.0 K/uL Final  . RBC 08/27/2016 4.59  3.80 - 5.20 MIL/uL Final  . Hemoglobin 08/27/2016 14.1  12.0 - 16.0 g/dL Final  . HCT 08/27/2016 41.0  35.0 - 47.0 % Final  . MCV 08/27/2016 89.3  80.0 - 100.0 fL Final  . MCH 08/27/2016 30.6  26.0 - 34.0 pg Final  . MCHC 08/27/2016 34.3  32.0 - 36.0 g/dL Final  . RDW 08/27/2016 13.3  11.5 - 14.5 % Final  . Platelets 08/27/2016 282  150 - 440 K/uL Final  . Neutrophils Relative % 08/27/2016 41  % Final  . Neutro Abs 08/27/2016 2.4  1.4 - 6.5 K/uL Final  . Lymphocytes Relative 08/27/2016 45  % Final  . Lymphs Abs 08/27/2016 2.6  1.0 - 3.6 K/uL Final  . Monocytes Relative 08/27/2016 10  % Final  . Monocytes Absolute 08/27/2016 0.6  0.2 - 0.9 K/uL Final  . Eosinophils Relative 08/27/2016 3  % Final  . Eosinophils Absolute 08/27/2016 0.1  0 - 0.7 K/uL Final  . Basophils Relative 08/27/2016 1  % Final  . Basophils Absolute 08/27/2016 0.0  0 - 0.1 K/uL Final  . Sodium 08/27/2016 135  135 - 145 mmol/L Final  . Potassium 08/27/2016 3.6  3.5 - 5.1 mmol/L Final  . Chloride 08/27/2016 106  101 - 111 mmol/L Final  . CO2 08/27/2016 23  22 - 32 mmol/L Final  . Glucose, Bld 08/27/2016 98  65 - 99 mg/dL Final  . BUN 08/27/2016 11  6 - 20 mg/dL Final  . Creatinine, Ser 08/27/2016 0.47  0.44 - 1.00 mg/dL Final  . Calcium 08/27/2016 8.8* 8.9 - 10.3 mg/dL Final  . Total Protein 08/27/2016 6.9  6.5 - 8.1 g/dL Final  . Albumin 08/27/2016 3.9  3.5 - 5.0 g/dL Final  . AST 08/27/2016 20  15 - 41 U/L Final  . ALT 08/27/2016 22  14 - 54 U/L Final  . Alkaline Phosphatase 08/27/2016 66  38 - 126 U/L Final  . Total Bilirubin 08/27/2016 0.6  0.3 - 1.2 mg/dL Final  . GFR calc non Af Amer 08/27/2016 >60  >60 mL/min Final  . GFR calc Af Amer 08/27/2016 >60  >60 mL/min Final   Comment: (NOTE) The eGFR  has been calculated using the CKD EPI equation. This calculation has not been validated in all clinical situations. eGFR's persistently <60 mL/min signify possible Chronic Kidney Disease.   . Anion gap 08/27/2016 6  5 - 15 Final    Assessment:  Kristina Eaton is a 41 y.o. female with left portal vein thrombosis. 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  History is negative for smoking, birth control pills, abdominal surgeries, trauma, inflammatory bowel disease, pancreatitis, and pregnancy.  She has no family history of any blood disorders or thrombosis.  She is on life long anticoagulation.  She is on Eliquis.  Chest CT angiogram on 11/20/2015 revealed no evidence of pulmonary embolism. Abdominal and pelvic CT scan on 11/20/2015 revealed left portal vein thrombosis beginning at the bifurcation with vessel expansion and differential liver enhancement.  Hypercoagulable work-up revealed heterozygosity  for Factor V Leiden (single R506Q mutation) and prothrombin gene mutation (single G-[id number] mutation).  Protein S-functional was low (48%) initially secondary to acute thrombosis.  Repeat protein S activity was 107% on 02/02/2016.  The following studies were negative:  lupus anticoagulant panel, anti-cardiolipin antibodies, beta2-glycoprotein, flow cytometry for PNH, JAK2 V617F, protein C antigen  (82%), protein C-functional (109%), protein S total (117%).  Symptomatically, she has a 3 day history of bilateral hand numbness and a 1 week history of left breast pain.  She denies any abdominal pain.  Her episodic rash has resolved.  Exam is stable.    Plan: 1.  Labs today:  CBC with diff, CMP.  2.  Continue Eliquis 5 mg BID. (Patient to find Medicaid number/card to refill). 3.  Contact clinic during interim if any interval concerns. 4.  Patient to call [facility] re: breast pain and hand numbness. 5.  RTC in 3 months for MD assessment and  labs (CBC with diff, CMP).   Lequita Asal, MD  11/25/2016, 4:20 AM

## 2016-12-13 ENCOUNTER — Inpatient Hospital Stay: Payer: Self-pay

## 2016-12-13 ENCOUNTER — Inpatient Hospital Stay: Payer: Self-pay | Admitting: Hematology and Oncology

## 2016-12-13 NOTE — Progress Notes (Deleted)
[facility] day:  12/13/16  Chief Complaint: Kristina Eaton is a 41 y.o. 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  HPI:  The patient was last seen in the hematology clinic on 08/27/2016.  At that time, she described a 3 day history of bilateral hand numbness and a 1 week history of left breast pain.  She denied any abdominal pain.  Her episodic rash had resolved.  Exam was stable.  Eliquis continued.  During the interim,    No past medical history on file.  Past Surgical History:  Procedure Laterality Date  . none      Family History  Problem Relation Age of Onset  . Diabetes Brother   . Hypertension Sister     Social History:  reports that she has never smoked. She has never used smokeless tobacco. She reports that she does not drink alcohol or use drugs.  She reports that she does not drink alcohol or use illicit drugs. She is from [place]. She has 3 children. She is accompanied by her boyfriend, daughter, and the interpreter today.  Allergies: No Known Allergies  Current Medications: Current Outpatient Prescriptions  Medication Sig Dispense Refill  . apixaban (ELIQUIS) 5 MG TABS tablet Take 1 tablet (5 mg total) by mouth 2 (two) times daily. 180 tablet 3  . etodolac (LODINE) 200 MG capsule Take 1 capsule (200 mg total) by mouth every 8 (eight) hours. (Patient not taking: Reported on 08/27/2016) 12 capsule 0  . HYDROcodone-acetaminophen (NORCO/VICODIN) 5-325 MG tablet Take 1-2 tablets by mouth every 4 (four) hours as needed for moderate pain. (Patient not taking: Reported on 08/27/2016) 30 tablet 0   No current facility-administered medications for this visit.     Review of Systems:  GENERAL: Feels "more or less good".No fevers or sweats.  Weight down 1 pound. PERFORMANCE STATUS (ECOG): 1 HEENT: No visual changes, runny nose, sore  throat, mouth sores or tenderness. Lungs:  No shortness of breath or cough. No hemoptysis. Cardiac: No chest pain, palpitations, orthopnea, or PND. GI: No abdominal pain.  No nausea, vomiting, diarrhea, constipation, melena or hematochezia. GU: No urgency, frequency, dysuria, or hematuria. Musculoskeletal: Joints (fingers, wrist, elbows, shoulder) hurt.No back pain.  No muscle tenderness. Extremities: No pain or swelling. Skin: No rashes, ulcers or bruises. Neuro: Hands numb x 3 days.  No headache, weakness, balance or coordination issues. Endocrine: No diabetes, thyroid issues, hot flashes or night sweats. Psych: No mood changes, depression or anxiety. Pain: Left breast pain x 1 week. Review of systems: All other systems reviewed and found to be negative  Physical Exam: 219 There were no vitals taken for this visit. GENERAL: Well developed, well nourished, heavyset woman sitting comfortably in the exam room in no acute distress. MENTAL STATUS: Alert and oriented to person, place and time. HEAD: Long black hair pulled back. Normocephalic, atraumatic, face symmetric, no Cushingoid features. EYES: Brown eyes. Pupils equal round and reactive to light and accomodation. No conjunctivitis or scleral icterus. ENT: Oropharynx clear without lesion. Tongue normal. Mucous membranes moist.  RESPIRATORY: Clear to auscultation without rales, wheezes or rhonchi. CARDIOVASCULAR: Regular rate and rhythm without murmur, rub or gallop. BREAST:  Left breast without edema, skin changes or nipple discharge. ABDOMEN: Soft, non-tender with active bowel sounds and no appreciable hepatosplenomegaly. No masses. SKIN: No rashes, ulcers or lesions. EXTREMITIES:  No edema, no skin discoloration  or tenderness. No palpable cords. LYMPH NODES: No palpable cervical, supraclavicular, axillary or inguinal adenopathy  NEUROLOGICAL: Bilateral hand grip normal. PSYCH: Appropriate.   No visits  with results within 3 Day(s) from this visit.  Latest known visit with results is:  Appointment on 08/27/2016  Component Date Value Ref Range Status  . WBC 08/27/2016 5.8  3.6 - 11.0 K/uL Final  . RBC 08/27/2016 4.59  3.80 - 5.20 MIL/uL Final  . Hemoglobin 08/27/2016 14.1  12.0 - 16.0 g/dL Final  . HCT 08/27/2016 41.0  35.0 - 47.0 % Final  . MCV 08/27/2016 89.3  80.0 - 100.0 fL Final  . MCH 08/27/2016 30.6  26.0 - 34.0 pg Final  . MCHC 08/27/2016 34.3  32.0 - 36.0 g/dL Final  . RDW 08/27/2016 13.3  11.5 - 14.5 % Final  . Platelets 08/27/2016 282  150 - 440 K/uL Final  . Neutrophils Relative % 08/27/2016 41  % Final  . Neutro Abs 08/27/2016 2.4  1.4 - 6.5 K/uL Final  . Lymphocytes Relative 08/27/2016 45  % Final  . Lymphs Abs 08/27/2016 2.6  1.0 - 3.6 K/uL Final  . Monocytes Relative 08/27/2016 10  % Final  . Monocytes Absolute 08/27/2016 0.6  0.2 - 0.9 K/uL Final  . Eosinophils Relative 08/27/2016 3  % Final  . Eosinophils Absolute 08/27/2016 0.1  0 - 0.7 K/uL Final  . Basophils Relative 08/27/2016 1  % Final  . Basophils Absolute 08/27/2016 0.0  0 - 0.1 K/uL Final  . Sodium 08/27/2016 135  135 - 145 mmol/L Final  . Potassium 08/27/2016 3.6  3.5 - 5.1 mmol/L Final  . Chloride 08/27/2016 106  101 - 111 mmol/L Final  . CO2 08/27/2016 23  22 - 32 mmol/L Final  . Glucose, Bld 08/27/2016 98  65 - 99 mg/dL Final  . BUN 08/27/2016 11  6 - 20 mg/dL Final  . Creatinine, Ser 08/27/2016 0.47  0.44 - 1.00 mg/dL Final  . Calcium 08/27/2016 8.8* 8.9 - 10.3 mg/dL Final  . Total Protein 08/27/2016 6.9  6.5 - 8.1 g/dL Final  . Albumin 08/27/2016 3.9  3.5 - 5.0 g/dL Final  . AST 08/27/2016 20  15 - 41 U/L Final  . ALT 08/27/2016 22  14 - 54 U/L Final  . Alkaline Phosphatase 08/27/2016 66  38 - 126 U/L Final  . Total Bilirubin 08/27/2016 0.6  0.3 - 1.2 mg/dL Final  . GFR calc non Af Amer 08/27/2016 >60  >60 mL/min Final  . GFR calc Af Amer 08/27/2016 >60  >60 mL/min Final   Comment: (NOTE) The  eGFR has been calculated using the CKD EPI equation. This calculation has not been validated in all clinical situations. eGFR's persistently <60 mL/min signify possible Chronic Kidney Disease.   . Anion gap 08/27/2016 6  5 - 15 Final    Assessment:  Kristina Eaton is a 41 y.o. female with left portal vein thrombosis. 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  History is negative for smoking, birth control pills, abdominal surgeries, trauma, inflammatory bowel disease, pancreatitis, and pregnancy.  She has no family history of any blood disorders or thrombosis.  She is on life long anticoagulation.  She is on Eliquis.  Chest CT angiogram on 11/20/2015 revealed no evidence of pulmonary embolism. Abdominal and pelvic CT scan on 11/20/2015 revealed left portal vein thrombosis beginning at the bifurcation with vessel expansion and differential liver enhancement.  Hypercoagulable work-up revealed  heterozygosity for Factor V Leiden (single R506Q mutation) and prothrombin gene mutation (single [id number] mutation).  Protein S-functional was low (48%) initially secondary to acute thrombosis.  Repeat protein S activity was 107% on 02/02/2016.  The following studies were negative:  lupus anticoagulant panel, anti-cardiolipin antibodies, beta2-glycoprotein, flow cytometry for PNH, JAK2 V617F, protein C antigen  (82%), protein C-functional (109%), protein S total (117%).  Symptomatically, she has a 3 day history of bilateral hand numbness and a 1 week history of left breast pain.  She denies any abdominal pain.  Her episodic rash has resolved.  Exam is stable.    Plan: 1.  Labs today:  CBC with diff, CMP.  2.  Continue Eliquis 5 mg BID. (Patient to find Medicaid number/card to refill). 3.  Contact clinic during interim if any interval concerns. 4.  Patient to call [facility] re: breast pain and hand numbness. 5.  RTC in 3 months for MD  assessment and labs (CBC with diff, CMP).   Lequita Asal, MD  12/13/2016, 7:27 AM

## 2016-12-26 NOTE — Progress Notes (Deleted)
[facility] day:  12/26/16  Chief Complaint: Kristina Eaton is a 41 y.o. 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  HPI:  The patient was last seen in the hematology clinic on 08/27/2016.  At that time, she described a 3 day history of bilateral hand numbness and a 1 week history of left breast pain.  She denied any abdominal pain.  Her episodic rash had resolved.  Exam was stable.  Eliquis continued.  During the interim,    No past medical history on file.  Past Surgical History:  Procedure Laterality Date  . none      Family History  Problem Relation Age of Onset  . Diabetes Brother   . Hypertension Sister     Social History:  reports that she has never smoked. She has never used smokeless tobacco. She reports that she does not drink alcohol or use drugs.  She reports that she does not drink alcohol or use illicit drugs. She is from [place]. She has 3 children. She is accompanied by her boyfriend, daughter, and the interpreter today.  Allergies: No Known Allergies  Current Medications: Current Outpatient Prescriptions  Medication Sig Dispense Refill  . apixaban (ELIQUIS) 5 MG TABS tablet Take 1 tablet (5 mg total) by mouth 2 (two) times daily. 180 tablet 3  . etodolac (LODINE) 200 MG capsule Take 1 capsule (200 mg total) by mouth every 8 (eight) hours. (Patient not taking: Reported on 08/27/2016) 12 capsule 0  . HYDROcodone-acetaminophen (NORCO/VICODIN) 5-325 MG tablet Take 1-2 tablets by mouth every 4 (four) hours as needed for moderate pain. (Patient not taking: Reported on 08/27/2016) 30 tablet 0   No current facility-administered medications for this visit.     Review of Systems:  GENERAL: Feels "more or less good".No fevers or sweats.  Weight down 1 pound. PERFORMANCE STATUS (ECOG): 1 HEENT: No visual changes, runny nose, sore  throat, mouth sores or tenderness. Lungs:  No shortness of breath or cough. No hemoptysis. Cardiac: No chest pain, palpitations, orthopnea, or PND. GI: No abdominal pain.  No nausea, vomiting, diarrhea, constipation, melena or hematochezia. GU: No urgency, frequency, dysuria, or hematuria. Musculoskeletal: Joints (fingers, wrist, elbows, shoulder) hurt.No back pain.  No muscle tenderness. Extremities: No pain or swelling. Skin: No rashes, ulcers or bruises. Neuro: Hands numb x 3 days.  No headache, weakness, balance or coordination issues. Endocrine: No diabetes, thyroid issues, hot flashes or night sweats. Psych: No mood changes, depression or anxiety. Pain: Left breast pain x 1 week. Review of systems: All other systems reviewed and found to be negative  Physical Exam: 219 There were no vitals taken for this visit. GENERAL: Well developed, well nourished, heavyset woman sitting comfortably in the exam room in no acute distress. MENTAL STATUS: Alert and oriented to person, place and time. HEAD: Long black hair pulled back. Normocephalic, atraumatic, face symmetric, no Cushingoid features. EYES: Brown eyes. Pupils equal round and reactive to light and accomodation. No conjunctivitis or scleral icterus. ENT: Oropharynx clear without lesion. Tongue normal. Mucous membranes moist.  RESPIRATORY: Clear to auscultation without rales, wheezes or rhonchi. CARDIOVASCULAR: Regular rate and rhythm without murmur, rub or gallop. BREAST:  Left breast without edema, skin changes or nipple discharge. ABDOMEN: Soft, non-tender with active bowel sounds and no appreciable hepatosplenomegaly. No masses. SKIN: No rashes, ulcers or lesions. EXTREMITIES:  No edema, no skin discoloration  or tenderness. No palpable cords. LYMPH NODES: No palpable cervical, supraclavicular, axillary or inguinal adenopathy  NEUROLOGICAL: Bilateral hand grip normal. PSYCH: Appropriate.   No visits  with results within 3 Day(s) from this visit.  Latest known visit with results is:  Appointment on 08/27/2016  Component Date Value Ref Range Status  . WBC 08/27/2016 5.8  3.6 - 11.0 K/uL Final  . RBC 08/27/2016 4.59  3.80 - 5.20 MIL/uL Final  . Hemoglobin 08/27/2016 14.1  12.0 - 16.0 g/dL Final  . HCT 08/27/2016 41.0  35.0 - 47.0 % Final  . MCV 08/27/2016 89.3  80.0 - 100.0 fL Final  . MCH 08/27/2016 30.6  26.0 - 34.0 pg Final  . MCHC 08/27/2016 34.3  32.0 - 36.0 g/dL Final  . RDW 08/27/2016 13.3  11.5 - 14.5 % Final  . Platelets 08/27/2016 282  150 - 440 K/uL Final  . Neutrophils Relative % 08/27/2016 41  % Final  . Neutro Abs 08/27/2016 2.4  1.4 - 6.5 K/uL Final  . Lymphocytes Relative 08/27/2016 45  % Final  . Lymphs Abs 08/27/2016 2.6  1.0 - 3.6 K/uL Final  . Monocytes Relative 08/27/2016 10  % Final  . Monocytes Absolute 08/27/2016 0.6  0.2 - 0.9 K/uL Final  . Eosinophils Relative 08/27/2016 3  % Final  . Eosinophils Absolute 08/27/2016 0.1  0 - 0.7 K/uL Final  . Basophils Relative 08/27/2016 1  % Final  . Basophils Absolute 08/27/2016 0.0  0 - 0.1 K/uL Final  . Sodium 08/27/2016 135  135 - 145 mmol/L Final  . Potassium 08/27/2016 3.6  3.5 - 5.1 mmol/L Final  . Chloride 08/27/2016 106  101 - 111 mmol/L Final  . CO2 08/27/2016 23  22 - 32 mmol/L Final  . Glucose, Bld 08/27/2016 98  65 - 99 mg/dL Final  . BUN 08/27/2016 11  6 - 20 mg/dL Final  . Creatinine, Ser 08/27/2016 0.47  0.44 - 1.00 mg/dL Final  . Calcium 08/27/2016 8.8* 8.9 - 10.3 mg/dL Final  . Total Protein 08/27/2016 6.9  6.5 - 8.1 g/dL Final  . Albumin 08/27/2016 3.9  3.5 - 5.0 g/dL Final  . AST 08/27/2016 20  15 - 41 U/L Final  . ALT 08/27/2016 22  14 - 54 U/L Final  . Alkaline Phosphatase 08/27/2016 66  38 - 126 U/L Final  . Total Bilirubin 08/27/2016 0.6  0.3 - 1.2 mg/dL Final  . GFR calc non Af Amer 08/27/2016 >60  >60 mL/min Final  . GFR calc Af Amer 08/27/2016 >60  >60 mL/min Final   Comment: (NOTE) The  eGFR has been calculated using the CKD EPI equation. This calculation has not been validated in all clinical situations. eGFR's persistently <60 mL/min signify possible Chronic Kidney Disease.   . Anion gap 08/27/2016 6  5 - 15 Final    Assessment:  Kristina Eaton is a 41 y.o. female with left portal vein thrombosis. 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  History is negative for smoking, birth control pills, abdominal surgeries, trauma, inflammatory bowel disease, pancreatitis, and pregnancy.  She has no family history of any blood disorders or thrombosis.  She is on life long anticoagulation.  She is on Eliquis.  Chest CT angiogram on 11/20/2015 revealed no evidence of pulmonary embolism. Abdominal and pelvic CT scan on 11/20/2015 revealed left portal vein thrombosis beginning at the bifurcation with vessel expansion and differential liver enhancement.  Hypercoagulable work-up revealed  heterozygosity for Factor V Leiden (single [id number] mutation) and prothrombin gene mutation (single [id number] mutation).  Protein S-functional was low (48%) initially secondary to acute thrombosis.  Repeat protein S activity was 107% on 02/02/2016.  The following studies were negative:  lupus anticoagulant panel, anti-cardiolipin antibodies, beta2-glycoprotein, flow cytometry for PNH, JAK2 V617F, protein C antigen  (82%), protein C-functional (109%), protein S total (117%).  Symptomatically, she has a 3 day history of bilateral hand numbness and a 1 week history of left breast pain.  She denies any abdominal pain.  Her episodic rash has resolved.  Exam is stable.    Plan: 1.  Labs today:  CBC with diff, CMP.  2.  Continue Eliquis 5 mg BID. (Patient to find Medicaid number/card to refill). 3.  Contact clinic during interim if any interval concerns. 4.  Patient to call [facility] re: breast pain and hand numbness. 5.  RTC in 3 months for MD  assessment and labs (CBC with diff, CMP).   Lequita Asal, MD  12/26/2016, 12:29 PM

## 2016-12-27 ENCOUNTER — Inpatient Hospital Stay: Payer: Self-pay

## 2016-12-27 ENCOUNTER — Inpatient Hospital Stay: Payer: Self-pay | Admitting: Hematology and Oncology

## 2017-01-14 ENCOUNTER — Other Ambulatory Visit: Payer: Self-pay | Admitting: Hematology and Oncology

## 2017-01-14 DIAGNOSIS — I81 Portal vein thrombosis: Secondary | ICD-10-CM

## 2017-01-14 MED ORDER — APIXABAN 5 MG PO TABS: 5.0000 mg | ORAL_TABLET | Freq: Two times a day (BID) | ORAL | 1 refills | Status: AC

## 2017-07-20 ENCOUNTER — Telehealth: Payer: Self-pay | Admitting: Pharmacy Technician

## 2017-07-20 NOTE — Telephone Encounter (Signed)
Patient failed to provide current poi.  No additional medication assistance will be provided by [facility] without the required proof of income documentation.  Patient notified by letter.  Krystalynn Ridgeway J. Jaionna Weisse Care Manager [facility] 

## 2017-10-11 IMAGING — CT CT ANGIO CHEST
1 of 2 series · 18 of 30 positions shown · IV contrast (APPLIED)
Comparison: 11/20/2015

CLINICAL DATA: Increase shortness of breath with new onset chest
pain this morning, left portal vein thrombosis

EXAM:
CT ANGIOGRAPHY CHEST WITH CONTRAST
TECHNIQUE: Multidetector CT imaging of the chest was performed using the
standard protocol during bolus administration of intravenous
contrast. Multiplanar CT image reconstructions and MIPs were
obtained to evaluate the vascular anatomy.
CONTRAST:  75 mL Isovue 370

[Series 5: pe 1.0 thins · axial · 0.69mm/px · z∈[-302,-64]mm · 18 of 268 slices shown]
[im 15/268  lung]
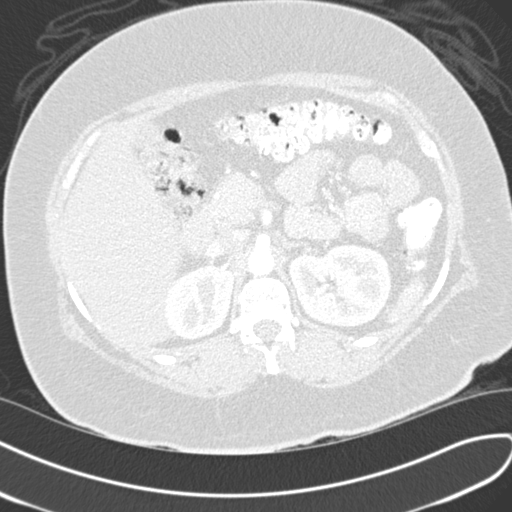
[im 30/268  mediastinal]
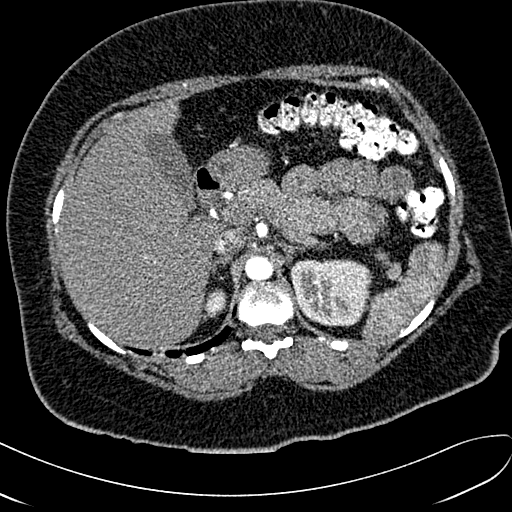
[im 45/268  lung]
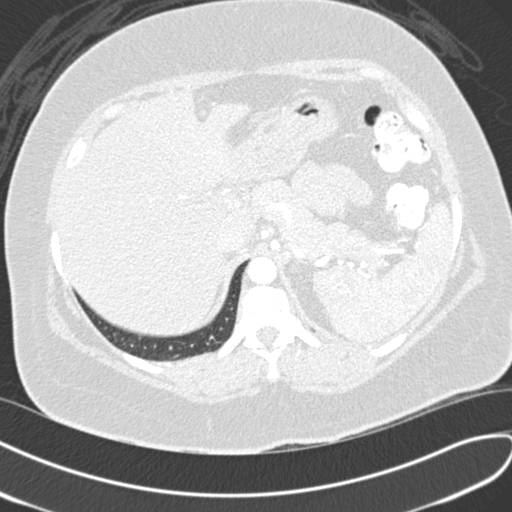
[im 60/268  mediastinal]
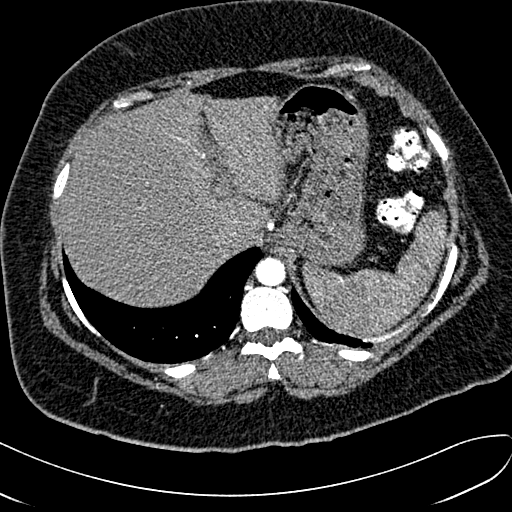
[im 75/268  lung]
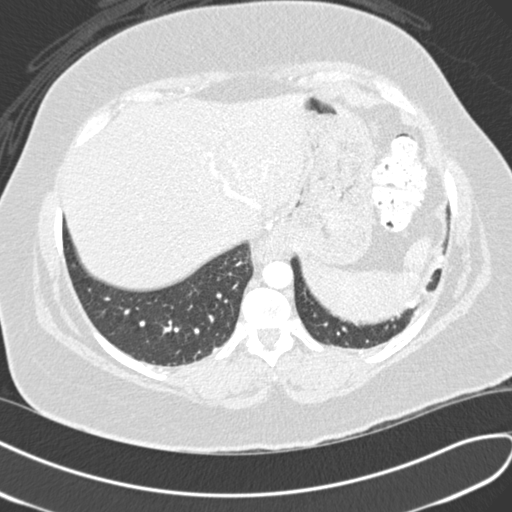
[im 90/268  mediastinal]
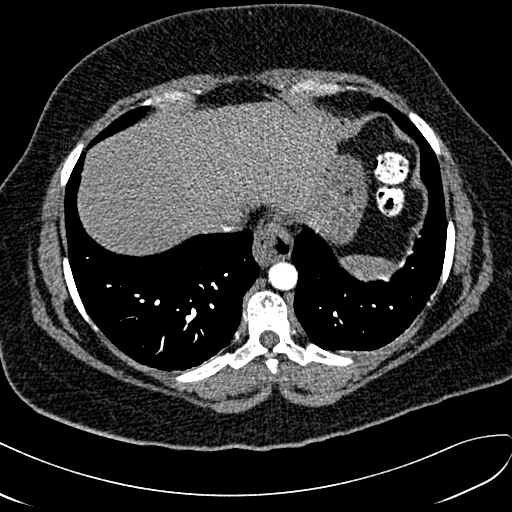
[im 104/268  lung]
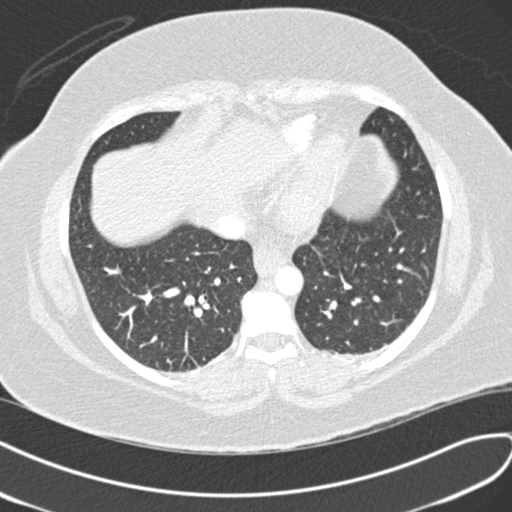
[im 119/268  mediastinal]
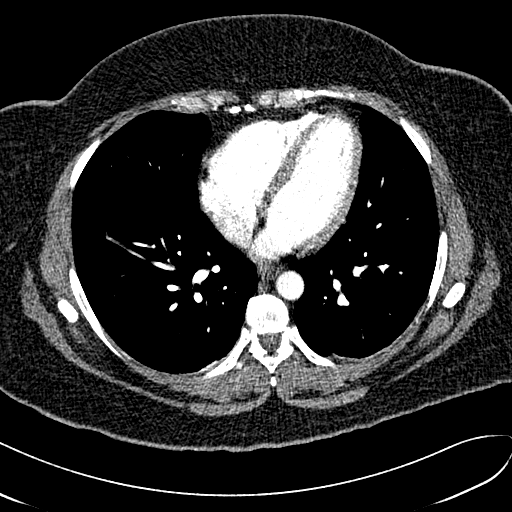
[im 125/268  lung]
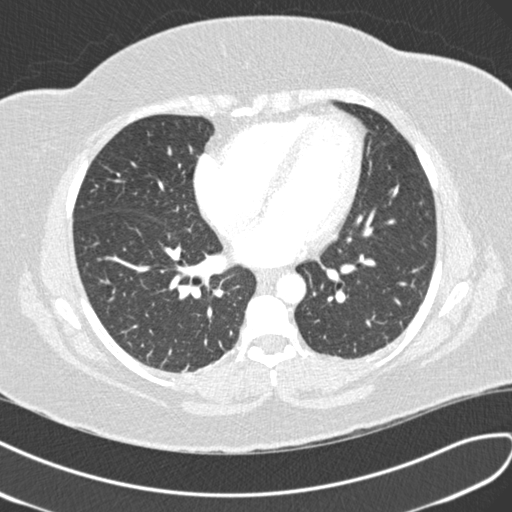
[im 134/268  mediastinal]
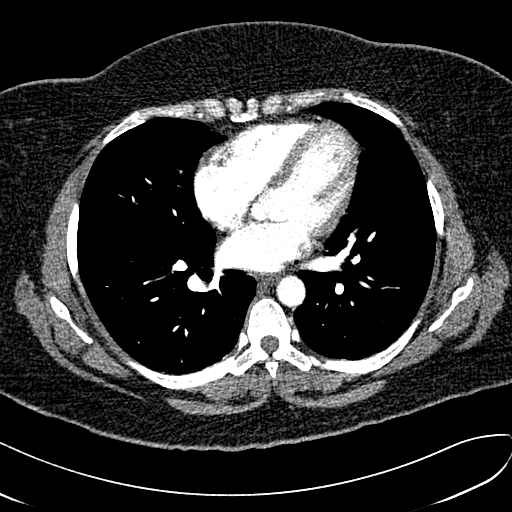
[im 149/268  lung]
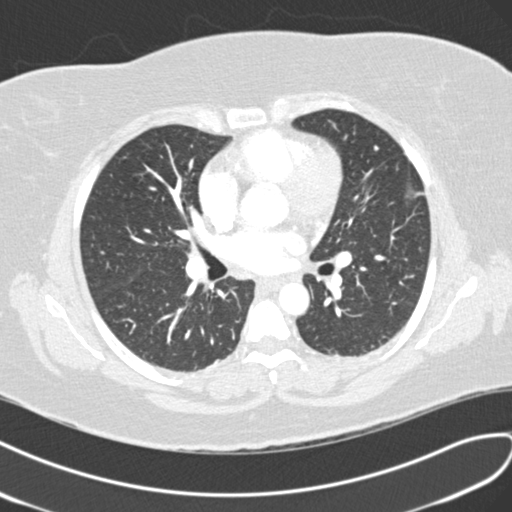
[im 164/268  mediastinal]
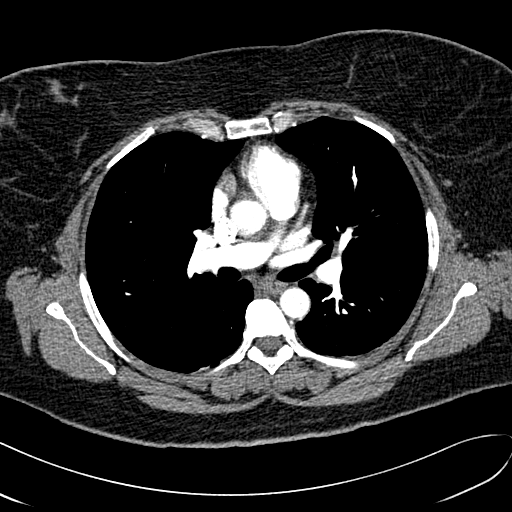
[im 179/268  lung]
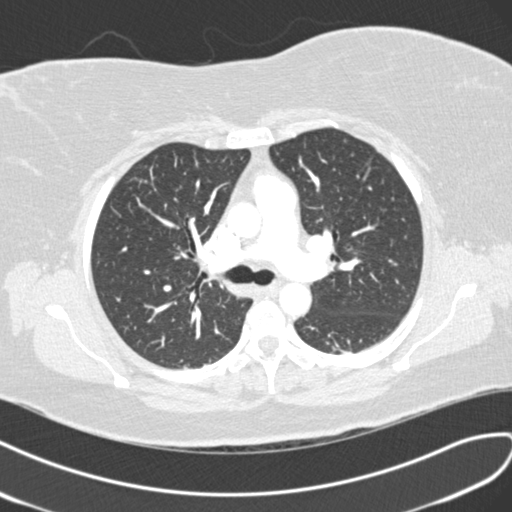
[im 193/268  mediastinal]
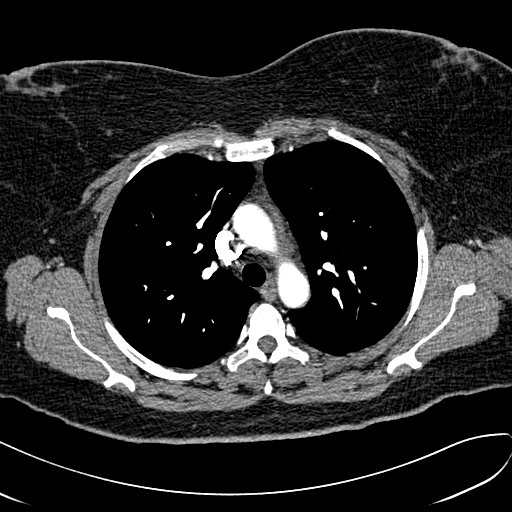
[im 208/268  lung]
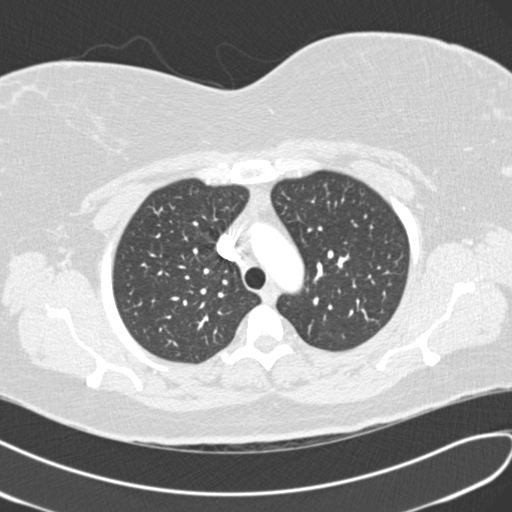
[im 223/268  mediastinal]
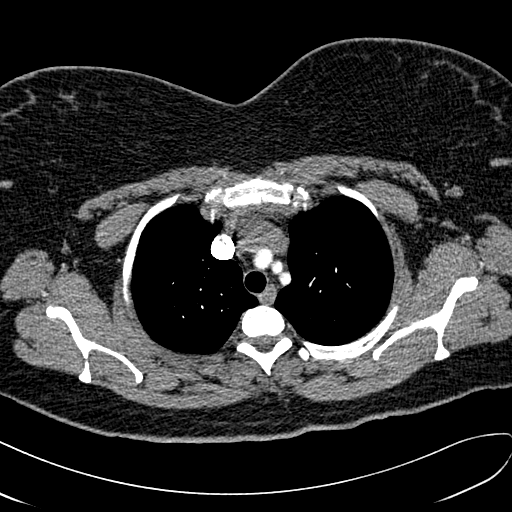
[im 238/268  lung]
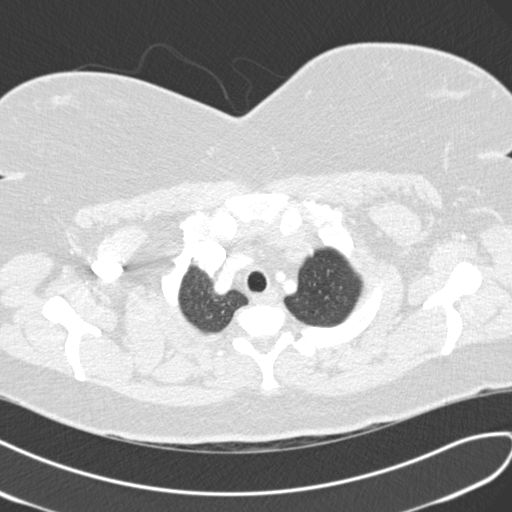
[im 253/268  mediastinal]
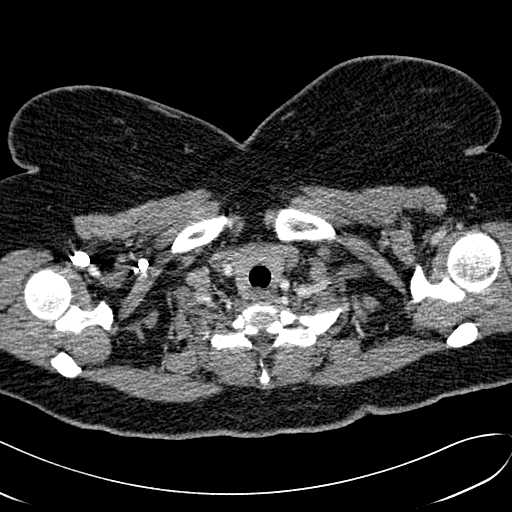

[18 of 30 positions shown; findings below may reference images not displayed]

FINDINGS: No significant pulmonary parenchymal opacification. No pleural
effusion.

No significant hilar or mediastinal mass.  No pericardial effusion.

Thoracic aorta shows no dissection or dilatation. There are no
filling defects in the pulmonary arterial system.

No acute abnormalities in the upper abdomen.  Small hiatal hernia.

No acute musculoskeletal findings.

Review of the MIP images confirms the above findings.
IMPRESSION: No evidence of pulmonary arterial embolism.

## 2021-11-03 ENCOUNTER — Emergency Department: Payer: Self-pay

## 2021-11-03 ENCOUNTER — Other Ambulatory Visit: Payer: Self-pay

## 2021-11-03 ENCOUNTER — Encounter: Payer: Self-pay | Admitting: Emergency Medicine

## 2021-11-03 ENCOUNTER — Emergency Department
Admission: EM | Admit: 2021-11-03 | Discharge: 2021-11-03 | Disposition: A | Discharge status: Home / Self Care | Payer: Self-pay | Attending: Emergency Medicine | Admitting: Emergency Medicine

## 2021-11-03 DIAGNOSIS — R0789 Other chest pain: Secondary | ICD-10-CM | POA: Insufficient documentation

## 2021-11-03 LAB — BASIC METABOLIC PANEL
Anion gap: 8 (ref 5–15)
BUN: 9 mg/dL (ref 6–20)
CO2: 22 mmol/L (ref 22–32)
Calcium: 9 mg/dL (ref 8.9–10.3)
Chloride: 107 mmol/L (ref 98–111)
Creatinine, Ser: 0.54 mg/dL (ref 0.44–1.00)
GFR, Estimated: >60 mL/min (ref >60)
Glucose, Bld: 185 mg/dL — ABNORMAL HIGH (ref 70–99)
Potassium: 3.7 mmol/L (ref 3.5–5.1)
Sodium: 137 mmol/L (ref 135–145)

## 2021-11-03 LAB — CBC
HCT: 44.1 % (ref 36.0–46.0)
Hemoglobin: 14.8 g/dL (ref 12.0–15.0)
MCH: 30.3 pg (ref 26.0–34.0)
MCHC: 33.6 g/dL (ref 30.0–36.0)
MCV: 90.2 fL (ref 80.0–100.0)
Platelets: 305 10*3/uL (ref 150–400)
RBC: 4.89 MIL/uL (ref 3.87–5.11)
RDW: 12.4 % (ref 11.5–15.5)
WBC: 6.2 10*3/uL (ref 4.0–10.5)
nRBC: 0 % (ref 0.0–0.2)

## 2021-11-03 LAB — TROPONIN I (HIGH SENSITIVITY): Troponin I (High Sensitivity): 4 ng/L (ref <18)

## 2021-11-03 MED ORDER — NAPROXEN 500 MG PO TABS: 500.0000 mg | ORAL_TABLET | Freq: Two times a day (BID) | ORAL | 2 refills | Status: AC

## 2021-11-03 MED ORDER — KETOROLAC TROMETHAMINE 30 MG/ML IJ SOLN
30.0000 mg | Freq: Once | INTRAMUSCULAR | Status: AC
  Administered 2021-11-03: 30 mg via INTRAMUSCULAR
  Filled 2021-11-03: qty 1

## 2021-11-03 NOTE — ED Triage Notes (Signed)
Pt to ED from home via EMS c/o chest pain radiating to back and left arm starting this morning and worsening over the day.  Denies n/v/d.  States dizziness and fever today but did not check temperature at home.  Given 324 ASA via EMS and CBG 146, took tylenol at home.  Pt A&Ox4, chest rise even and unlabored, skin WNL and in NAD at this time. ?

## 2021-11-03 NOTE — ED Provider Notes (Signed)
? ?[facility] ?Provider Note ? ? ? Event Date/Time  ? First MD Initiated Contact with Patient 11/03/21 2140   ?  (approximate) ? ? ?History  ? ?Chest Pain ? ? ?HPI ? ?Kristina Eaton is a 46 y.o. female with a history of factor V Leiden mutation on Eliquis, obesity who presents with chest pain.  Patient describes anterior chest pain which started this morning when she woke up.  She denies injury to the area.  She reports it is sensitive to the touch.  No rash, no recent significant exertion.  No history of heart disease.  No shortness of breath, no cough or fevers or chills ?  ? ? ?Physical Exam  ? ?Triage Vital Signs: ?ED Triage Vitals  ?Enc Vitals Group  ?   BP 11/03/21 2114 120/87  ?   Pulse Rate 11/03/21 2114 90  ?   Resp 11/03/21 2114 16  ?   Temp 11/03/21 2114 98.7 ?F (37.1 ?C)  ?   Temp Source 11/03/21 2114 Oral  ?   SpO2 11/03/21 2114 98 %  ?   Weight 11/03/21 2111 81.6 kg (180 lb)  ?   Height 11/03/21 2111 1.549 m (5\' 1" )  ?   Head Circumference --   ?   Peak Flow --   ?   Pain Score 11/03/21 2110 8  ?   Pain Loc --   ?   Pain Edu? --   ?   Excl. in [place]? --   ? ? ?Most recent vital signs: ?Vitals:  ? 11/03/21 2114 11/03/21 2223  ?BP: 120/87 120/86  ?Pulse: 90 85  ?Resp: 16 16  ?Temp: 98.7 ?F (37.1 ?C)   ?SpO2: 98% 98%  ? ? ? ?General: Awake, no distress.  ?CV:  Good peripheral perfusion.  Very reproducible chest pain with palpation to the anterior superior chest wall ?Resp:  Normal effort.  CTA B ?Abd:  No distention.  ?Other:  No calf pain or swelling ? ? ?ED Results / Procedures / Treatments  ? ?Labs ?(all labs ordered are listed, but only abnormal results are displayed) ?Labs Reviewed  ?BASIC METABOLIC PANEL - Abnormal; Notable for the following components:  ?    Result Value  ? Glucose, Bld 185 (*)   ? All other components within normal limits  ?CBC  ?POC URINE PREG, ED  ?TROPONIN I (HIGH SENSITIVITY)  ? ? ? ?EKG ? ?ED ECG REPORT ?I, Lavonia Drafts, the attending physician, personally  viewed and interpreted this ECG. ? ?Date: 11/03/2021 ? ?Rhythm: normal sinus rhythm ?QRS Axis: normal ?Intervals: normal ?ST/T Wave abnormalities: normal ?Narrative Interpretation: no evidence of acute ischemia ? ? ? ?RADIOLOGY ?Chest x-ray viewed interpreted by me, no acute abnormality ? ? ? ?PROCEDURES: ? ?Critical Care performed:  ? ?Procedures ? ? ?MEDICATIONS ORDERED IN ED: ?Medications  ?ketorolac (TORADOL) 30 MG/ML injection 30 mg (30 mg Intramuscular Given 11/03/21 2217)  ? ? ? ?IMPRESSION / MDM / ASSESSMENT AND PLAN / ED COURSE  ?I reviewed the triage vital signs and the nursing notes. ? ? ? ?Patient presents with chest pain as described above, her exam demonstrates very reproducible chest tenderness to palpation.  No rash fluid collection evidence of infection.  No bony abnormalities.  Consistent with musculoskeletal chest wall pain. ? ?EKG and high-sensitivity troponin are normal.  Chest x-ray is negative for cardiomegaly, edema or infiltrate ? ?Patient treated with IM Toradol, will treat with NSAIDs, outpatient follow-up with PCP recommended. ? ? ? ?  ? ? ?  FINAL CLINICAL IMPRESSION(S) / ED DIAGNOSES  ? ?Final diagnoses:  ?Chest wall pain  ? ? ? ?Rx / DC Orders  ? ?ED Discharge Orders   ? ?      Ordered  ?  naproxen (NAPROSYN) 500 MG tablet  2 times daily with meals       ? 11/03/21 2213  ? ?  ?  ? ?  ? ? ? ?Note:  This document was prepared using Dragon voice recognition software and may include unintentional dictation errors. ?  ?Lavonia Drafts, MD ?11/03/21 2235 ? ?
